# Patient Record
Sex: Female | Born: 1980
Health system: Southern US, Community
[De-identification: ages and names within clinical notes are randomized; demographics above are authoritative.]

## PROBLEM LIST (undated history)

## (undated) DIAGNOSIS — R87629 Unspecified abnormal cytological findings in specimens from vagina: Secondary | ICD-10-CM

## (undated) DIAGNOSIS — O26899 Other specified pregnancy related conditions, unspecified trimester: Secondary | ICD-10-CM

## (undated) DIAGNOSIS — E039 Hypothyroidism, unspecified: Secondary | ICD-10-CM

## (undated) DIAGNOSIS — G5600 Carpal tunnel syndrome, unspecified upper limb: Secondary | ICD-10-CM

## (undated) HISTORY — PX: NO PAST SURGERIES: SHX2092

---

## 2009-01-26 ENCOUNTER — Ambulatory Visit (HOSPITAL_COMMUNITY): Admission: RE | Admit: 2009-01-26 | Discharge: 2009-01-26 | Payer: Self-pay | Admitting: Obstetrics and Gynecology

## 2009-02-22 ENCOUNTER — Ambulatory Visit (HOSPITAL_COMMUNITY): Admission: RE | Admit: 2009-02-22 | Discharge: 2009-02-22 | Payer: Self-pay | Admitting: Obstetrics and Gynecology

## 2009-06-20 ENCOUNTER — Inpatient Hospital Stay (HOSPITAL_COMMUNITY): Admission: AD | Admit: 2009-06-20 | Discharge: 2009-06-23 | Payer: Self-pay | Admitting: Obstetrics and Gynecology

## 2009-06-21 ENCOUNTER — Encounter (INDEPENDENT_AMBULATORY_CARE_PROVIDER_SITE_OTHER): Payer: Self-pay | Admitting: Obstetrics and Gynecology

## 2010-04-08 ENCOUNTER — Encounter: Payer: Self-pay | Admitting: Obstetrics and Gynecology

## 2010-06-06 LAB — CBC
HCT: 35.4 % — ABNORMAL LOW (ref 36.0–46.0)
HCT: 41 % (ref 36.0–46.0)
Hemoglobin: 12.2 g/dL (ref 12.0–15.0)
Hemoglobin: 13.7 g/dL (ref 12.0–15.0)
MCHC: 34.5 g/dL (ref 30.0–36.0)
MCV: 93.4 fL (ref 78.0–100.0)
RBC: 3.79 MIL/uL — ABNORMAL LOW (ref 3.87–5.11)
RBC: 4.43 MIL/uL (ref 3.87–5.11)
WBC: 13.9 10*3/uL — ABNORMAL HIGH (ref 4.0–10.5)
WBC: 14.3 10*3/uL — ABNORMAL HIGH (ref 4.0–10.5)

## 2010-07-24 ENCOUNTER — Other Ambulatory Visit: Payer: Self-pay | Admitting: Obstetrics and Gynecology

## 2011-01-28 ENCOUNTER — Other Ambulatory Visit: Payer: Self-pay | Admitting: Obstetrics and Gynecology

## 2011-01-30 ENCOUNTER — Inpatient Hospital Stay (HOSPITAL_COMMUNITY): Payer: BC Managed Care – PPO

## 2011-01-30 ENCOUNTER — Encounter (HOSPITAL_COMMUNITY): Payer: Self-pay

## 2011-01-30 ENCOUNTER — Encounter (HOSPITAL_COMMUNITY): Payer: Self-pay | Admitting: *Deleted

## 2011-01-30 ENCOUNTER — Inpatient Hospital Stay (HOSPITAL_COMMUNITY)
Admission: AD | Admit: 2011-01-30 | Discharge: 2011-02-01 | DRG: 371 | Disposition: A | Discharge status: Home / Self Care | Payer: BC Managed Care – PPO | Source: Ambulatory Visit | Attending: Obstetrics and Gynecology | Admitting: Obstetrics and Gynecology

## 2011-01-30 ENCOUNTER — Encounter (HOSPITAL_COMMUNITY)
Admission: AD | Disposition: A | Discharge status: Home / Self Care | Payer: Self-pay | Source: Ambulatory Visit | Attending: Obstetrics and Gynecology

## 2011-01-30 DIAGNOSIS — O34219 Maternal care for unspecified type scar from previous cesarean delivery: Principal | ICD-10-CM | POA: Diagnosis present

## 2011-01-30 DIAGNOSIS — E039 Hypothyroidism, unspecified: Secondary | ICD-10-CM | POA: Diagnosis present

## 2011-01-30 DIAGNOSIS — E079 Disorder of thyroid, unspecified: Secondary | ICD-10-CM | POA: Diagnosis present

## 2011-01-30 LAB — ABO/RH: ABO/RH(D): O NEG

## 2011-01-30 LAB — CBC
HCT: 39.3 % (ref 36.0–46.0)
Hemoglobin: 13.2 g/dL (ref 12.0–15.0)
MCH: 30.3 pg (ref 26.0–34.0)
RBC: 4.36 MIL/uL (ref 3.87–5.11)

## 2011-01-30 LAB — RPR: RPR Ser Ql: NONREACTIVE

## 2011-01-30 SURGERY — Surgical Case
Anesthesia: Spinal | Site: Abdomen | Wound class: Clean Contaminated

## 2011-01-30 MED ORDER — SENNOSIDES-DOCUSATE SODIUM 8.6-50 MG PO TABS: 2.0000 | ORAL_TABLET | Freq: Every day | ORAL | Status: DC

## 2011-01-30 MED ORDER — CITRIC ACID-SODIUM CITRATE 334-500 MG/5ML PO SOLN
30.0000 mL | Freq: Once | ORAL | Status: AC
  Administered 2011-01-30: 30 mL via ORAL
  Filled 2011-01-30: qty 15

## 2011-01-30 MED ORDER — MENTHOL 3 MG MT LOZG: 1.0000 | LOZENGE | OROMUCOSAL | Status: DC | PRN

## 2011-01-30 MED ORDER — LACTATED RINGERS IV SOLN: INTRAVENOUS | Status: DC

## 2011-01-30 MED ORDER — KETOROLAC TROMETHAMINE 30 MG/ML IJ SOLN: 30.0000 mg | Freq: Four times a day (QID) | INTRAMUSCULAR | Status: AC | PRN

## 2011-01-30 MED ORDER — CEFAZOLIN SODIUM 1-5 GM-% IV SOLN
1.0000 g | INTRAVENOUS | Status: AC
  Administered 2011-01-30: 1 g via INTRAVENOUS
  Filled 2011-01-30: qty 50

## 2011-01-30 MED ORDER — OXYTOCIN 20 UNITS IN LACTATED RINGERS INFUSION - SIMPLE: 125.0000 mL/h | INTRAVENOUS | Status: AC

## 2011-01-30 MED ORDER — SIMETHICONE 80 MG PO CHEW: 80.0000 mg | CHEWABLE_TABLET | Freq: Three times a day (TID) | ORAL | Status: DC

## 2011-01-30 MED ORDER — DIPHENHYDRAMINE HCL 50 MG/ML IJ SOLN: 12.5000 mg | INTRAMUSCULAR | Status: DC | PRN

## 2011-01-30 MED ORDER — SIMETHICONE 80 MG PO CHEW: 80.0000 mg | CHEWABLE_TABLET | ORAL | Status: DC | PRN

## 2011-01-30 MED ORDER — SODIUM CHLORIDE 0.9 % IV SOLN
1.0000 ug/kg/h | INTRAVENOUS | Status: DC | PRN
  Filled 2011-01-30: qty 2.5

## 2011-01-30 MED ORDER — LACTATED RINGERS IV SOLN
INTRAVENOUS | Status: DC
  Administered 2011-01-30 (×2): via INTRAVENOUS

## 2011-01-30 MED ORDER — OXYCODONE-ACETAMINOPHEN 5-325 MG PO TABS
1.0000 | ORAL_TABLET | ORAL | Status: DC | PRN
  Administered 2011-01-31 – 2011-02-01 (×4): 1 via ORAL
  Filled 2011-01-30 (×4): qty 1

## 2011-01-30 MED ORDER — ONDANSETRON HCL 4 MG/2ML IJ SOLN
INTRAMUSCULAR | Status: DC | PRN
  Administered 2011-01-30: 4 mg via INTRAVENOUS

## 2011-01-30 MED ORDER — ONDANSETRON HCL 4 MG/2ML IJ SOLN: 4.0000 mg | INTRAMUSCULAR | Status: DC | PRN

## 2011-01-30 MED ORDER — MEPERIDINE HCL 25 MG/ML IJ SOLN: 6.2500 mg | INTRAMUSCULAR | Status: DC | PRN

## 2011-01-30 MED ORDER — DIBUCAINE 1 % RE OINT: 1.0000 "application " | TOPICAL_OINTMENT | RECTAL | Status: DC | PRN

## 2011-01-30 MED ORDER — BUPIVACAINE HCL (PF) 0.25 % IJ SOLN
INTRAMUSCULAR | Status: DC | PRN
  Administered 2011-01-30: 10 mL

## 2011-01-30 MED ORDER — DIPHENHYDRAMINE HCL 25 MG PO CAPS: 25.0000 mg | ORAL_CAPSULE | ORAL | Status: DC | PRN

## 2011-01-30 MED ORDER — LEVOTHYROXINE SODIUM 150 MCG PO TABS
150.0000 ug | ORAL_TABLET | Freq: Every day | ORAL | Status: DC
  Administered 2011-01-31 – 2011-02-01 (×2): 150 ug via ORAL
  Filled 2011-01-30 (×2): qty 1

## 2011-01-30 MED ORDER — OXYCODONE-ACETAMINOPHEN 5-325 MG PO TABS: 1.0000 | ORAL_TABLET | ORAL | Status: DC | PRN

## 2011-01-30 MED ORDER — WITCH HAZEL-GLYCERIN EX PADS: 1.0000 "application " | MEDICATED_PAD | CUTANEOUS | Status: DC | PRN

## 2011-01-30 MED ORDER — PRENATAL PLUS 27-1 MG PO TABS: 1.0000 | ORAL_TABLET | Freq: Every day | ORAL | Status: DC

## 2011-01-30 MED ORDER — SIMETHICONE 80 MG PO CHEW
80.0000 mg | CHEWABLE_TABLET | Freq: Three times a day (TID) | ORAL | Status: DC
  Administered 2011-01-30 – 2011-02-01 (×7): 80 mg via ORAL

## 2011-01-30 MED ORDER — TETANUS-DIPHTH-ACELL PERTUSSIS 5-2.5-18.5 LF-MCG/0.5 IM SUSP: 0.5000 mL | Freq: Once | INTRAMUSCULAR | Status: DC

## 2011-01-30 MED ORDER — ONDANSETRON HCL 4 MG PO TABS: 4.0000 mg | ORAL_TABLET | ORAL | Status: DC | PRN

## 2011-01-30 MED ORDER — METHYLERGONOVINE MALEATE 0.2 MG PO TABS: 0.2000 mg | ORAL_TABLET | ORAL | Status: DC | PRN

## 2011-01-30 MED ORDER — IBUPROFEN 600 MG PO TABS: 600.0000 mg | ORAL_TABLET | Freq: Four times a day (QID) | ORAL | Status: DC

## 2011-01-30 MED ORDER — NALBUPHINE HCL 10 MG/ML IJ SOLN
5.0000 mg | INTRAMUSCULAR | Status: DC | PRN
  Filled 2011-01-30: qty 1

## 2011-01-30 MED ORDER — LANOLIN HYDROUS EX OINT: 1.0000 "application " | TOPICAL_OINTMENT | CUTANEOUS | Status: DC | PRN

## 2011-01-30 MED ORDER — MORPHINE SULFATE (PF) 0.5 MG/ML IJ SOLN
INTRAMUSCULAR | Status: DC | PRN
  Administered 2011-01-30: .2 mg via INTRATHECAL

## 2011-01-30 MED ORDER — ONDANSETRON HCL 4 MG/2ML IJ SOLN
4.0000 mg | INTRAMUSCULAR | Status: DC | PRN
Start: 2011-01-30 — End: 2011-02-01

## 2011-01-30 MED ORDER — IBUPROFEN 600 MG PO TABS: 600.0000 mg | ORAL_TABLET | Freq: Four times a day (QID) | ORAL | Status: DC | PRN

## 2011-01-30 MED ORDER — METHYLERGONOVINE MALEATE 0.2 MG/ML IJ SOLN: 0.2000 mg | INTRAMUSCULAR | Status: DC | PRN

## 2011-01-30 MED ORDER — ONDANSETRON HCL 4 MG/2ML IJ SOLN: 4.0000 mg | Freq: Three times a day (TID) | INTRAMUSCULAR | Status: DC | PRN

## 2011-01-30 MED ORDER — IBUPROFEN 600 MG PO TABS
600.0000 mg | ORAL_TABLET | Freq: Four times a day (QID) | ORAL | Status: DC
  Administered 2011-01-30 – 2011-02-01 (×7): 600 mg via ORAL
  Filled 2011-01-30 (×7): qty 1

## 2011-01-30 MED ORDER — ZOLPIDEM TARTRATE 5 MG PO TABS: 5.0000 mg | ORAL_TABLET | Freq: Every evening | ORAL | Status: DC | PRN

## 2011-01-30 MED ORDER — BUTORPHANOL TARTRATE 2 MG/ML IJ SOLN
1.0000 mg | Freq: Once | INTRAMUSCULAR | Status: AC
  Administered 2011-01-30: 2 mg via INTRAVENOUS
  Filled 2011-01-30: qty 1

## 2011-01-30 MED ORDER — DIPHENHYDRAMINE HCL 50 MG/ML IJ SOLN: 25.0000 mg | INTRAMUSCULAR | Status: DC | PRN

## 2011-01-30 MED ORDER — PROMETHAZINE HCL 25 MG/ML IJ SOLN: 6.2500 mg | INTRAMUSCULAR | Status: DC | PRN

## 2011-01-30 MED ORDER — OXYTOCIN 10 UNIT/ML IJ SOLN
INTRAMUSCULAR | Status: DC | PRN
  Administered 2011-01-30: 40 [IU] via INTRAMUSCULAR

## 2011-01-30 MED ORDER — DIPHENHYDRAMINE HCL 25 MG PO CAPS: 25.0000 mg | ORAL_CAPSULE | Freq: Four times a day (QID) | ORAL | Status: DC | PRN

## 2011-01-30 MED ORDER — KETOROLAC TROMETHAMINE 30 MG/ML IJ SOLN: 15.0000 mg | Freq: Once | INTRAMUSCULAR | Status: AC | PRN

## 2011-01-30 MED ORDER — SODIUM CHLORIDE 0.9 % IJ SOLN: 3.0000 mL | INTRAMUSCULAR | Status: DC | PRN

## 2011-01-30 MED ORDER — LACTATED RINGERS IV SOLN
INTRAVENOUS | Status: DC | PRN
  Administered 2011-01-30 (×2): via INTRAVENOUS

## 2011-01-30 MED ORDER — BUPIVACAINE IN DEXTROSE 0.75-8.25 % IT SOLN
INTRATHECAL | Status: DC | PRN
  Administered 2011-01-30: 1.4 mL via INTRATHECAL

## 2011-01-30 MED ORDER — LACTATED RINGERS IV SOLN
INTRAVENOUS | Status: DC
  Administered 2011-01-30: 13:00:00 via INTRAVENOUS

## 2011-01-30 MED ORDER — FENTANYL CITRATE 0.05 MG/ML IJ SOLN
INTRAMUSCULAR | Status: DC | PRN
  Administered 2011-01-30: 12.5 ug via INTRATHECAL
  Administered 2011-01-30: 50 ug via INTRAVENOUS
  Administered 2011-01-30: 12.5 ug via INTRAVENOUS
  Administered 2011-01-30: 25 ug via INTRAVENOUS

## 2011-01-30 MED ORDER — OXYTOCIN 20 UNITS IN LACTATED RINGERS INFUSION - SIMPLE
125.0000 mL/h | INTRAVENOUS | Status: DC
  Administered 2011-01-30: 125 mL/h via INTRAVENOUS

## 2011-01-30 MED ORDER — TETANUS-DIPHTH-ACELL PERTUSSIS 5-2.5-18.5 LF-MCG/0.5 IM SUSP
0.5000 mL | Freq: Once | INTRAMUSCULAR | Status: AC
  Administered 2011-01-31: 0.5 mL via INTRAMUSCULAR
  Filled 2011-01-30: qty 0.5

## 2011-01-30 MED ORDER — FAMOTIDINE IN NACL 20-0.9 MG/50ML-% IV SOLN
20.0000 mg | Freq: Once | INTRAVENOUS | Status: AC
  Administered 2011-01-30: 20 mg via INTRAVENOUS
  Filled 2011-01-30: qty 50

## 2011-01-30 MED ORDER — NALOXONE HCL 0.4 MG/ML IJ SOLN: 0.4000 mg | INTRAMUSCULAR | Status: DC | PRN

## 2011-01-30 MED ORDER — PRENATAL PLUS 27-1 MG PO TABS
1.0000 | ORAL_TABLET | Freq: Every day | ORAL | Status: DC
  Administered 2011-01-31 – 2011-02-01 (×2): 1 via ORAL
  Filled 2011-01-30 (×2): qty 1

## 2011-01-30 MED ORDER — SENNOSIDES-DOCUSATE SODIUM 8.6-50 MG PO TABS
2.0000 | ORAL_TABLET | Freq: Every day | ORAL | Status: DC
  Administered 2011-01-30 – 2011-01-31 (×2): 2 via ORAL

## 2011-01-30 MED ORDER — HYDROMORPHONE HCL PF 1 MG/ML IJ SOLN: 0.2500 mg | INTRAMUSCULAR | Status: DC | PRN

## 2011-01-30 MED ORDER — KETOROLAC TROMETHAMINE 60 MG/2ML IM SOLN
60.0000 mg | Freq: Once | INTRAMUSCULAR | Status: AC | PRN
  Administered 2011-01-30: 60 mg via INTRAMUSCULAR

## 2011-01-30 SURGICAL SUPPLY — 28 items
CLOTH BEACON ORANGE TIMEOUT ST (SAFETY) ×2 IMPLANT
CONTAINER PREFILL 10% NBF 15ML (MISCELLANEOUS) IMPLANT
DRESSING TELFA 8X3 (GAUZE/BANDAGES/DRESSINGS) ×2 IMPLANT
ELECT REM PT RETURN 9FT ADLT (ELECTROSURGICAL) ×2
ELECTRODE REM PT RTRN 9FT ADLT (ELECTROSURGICAL) ×1 IMPLANT
EXTRACTOR VACUUM M CUP 4 TUBE (SUCTIONS) IMPLANT
GAUZE SPONGE 4X4 12PLY STRL LF (GAUZE/BANDAGES/DRESSINGS) ×2 IMPLANT
GLOVE BIO SURGEON STRL SZ7.5 (GLOVE) ×4 IMPLANT
GOWN PREVENTION PLUS LG XLONG (DISPOSABLE) ×4 IMPLANT
GOWN PREVENTION PLUS XLARGE (GOWN DISPOSABLE) ×2 IMPLANT
KIT ABG SYR 3ML LUER SLIP (SYRINGE) IMPLANT
NEEDLE HYPO 25X1 1.5 SAFETY (NEEDLE) ×2 IMPLANT
NEEDLE HYPO 25X5/8 SAFETYGLIDE (NEEDLE) IMPLANT
NS IRRIG 1000ML POUR BTL (IV SOLUTION) ×2 IMPLANT
PACK C SECTION WH (CUSTOM PROCEDURE TRAY) ×2 IMPLANT
PAD ABD 7.5X8 STRL (GAUZE/BANDAGES/DRESSINGS) ×2 IMPLANT
SLEEVE SCD COMPRESS KNEE MED (MISCELLANEOUS) ×2 IMPLANT
STAPLER VISISTAT 35W (STAPLE) ×2 IMPLANT
SUT MNCRL 0 VIOLET CTX 36 (SUTURE) ×2 IMPLANT
SUT MON AB 2-0 CT1 27 (SUTURE) ×2 IMPLANT
SUT MON AB-0 CT1 36 (SUTURE) ×4 IMPLANT
SUT MONOCRYL 0 CTX 36 (SUTURE) ×2
SUT PLAIN 0 NONE (SUTURE) IMPLANT
SUT PLAIN 2 0 XLH (SUTURE) IMPLANT
SYR CONTROL 10ML LL (SYRINGE) ×2 IMPLANT
TOWEL OR 17X24 6PK STRL BLUE (TOWEL DISPOSABLE) ×4 IMPLANT
TRAY FOLEY CATH 14FR (SET/KITS/TRAYS/PACK) ×2 IMPLANT
WATER STERILE IRR 1000ML POUR (IV SOLUTION) ×2 IMPLANT

## 2011-01-30 NOTE — Progress Notes (Signed)
Dr Arby Barrette ,Dr Anibal Henderson in [place] and Kenney Houseman, AC-notified of upcoming repeat C/S

## 2011-01-30 NOTE — Op Note (Signed)
Cesarean Section Procedure Note  Indications: Previous Csection x 1 with SROM , meconium and active labor  Pre-operative Diagnosis: 38 week 0 day pregnancy.  Post-operative Diagnosis: same  Surgeon: Lenoard Aden   Assistants: none  Anesthesia: Spinal anesthesia  ASA Class: 1, 2  Procedure Details  The patient was seen in the Holding Room. The risks, benefits, complications, treatment options, and expected outcomes were discussed with the patient.  The patient concurred with the proposed plan, giving informed consent. The risks of anesthesia, infection, bleeding and possible injury to other organs discussed. Injury to bowel, bladder, or ureter with possible need for repair discussed. Possible need for transfusion with secondary risks of hepatitis or HIV acquisition discussed. Post operative complications to include but not limited to DVT, PE and Pneumonia noted. The site of surgery properly noted/marked. The patient was taken to Operating Room # 1, identified as Charlise Giovanetti and the procedure verified as C-Section Delivery. A Time Out was held and the above information confirmed.  After induction of anesthesia, the patient was draped and prepped in the usual sterile manner. A Pfannenstiel incision was made and carried down through the subcutaneous tissue to the fascia. Fascial incision was made and extended transversely using Mayo scissors. The fascia was separated from the underlying rectus tissue superiorly and inferiorly. The peritoneum was identified and entered. Peritoneal incision was extended longitudinally. The utero-vesical peritoneal reflection was incised transversely and the bladder flap was bluntly freed from the lower uterine segment. A low transverse uterine incision(Kerr hysterotomy) was made. Delivered from vertex presentation was a  Female  with Apgar scores of 8 at one minute and 9 at five minutes. After the umbilical cord was clamped and cut cord blood was obtained for  evaluation. The placenta was removed intact and appeared normal. The uterine outline, tubes and ovaries appeared normal. The uterine incision was closed with running locked sutures of 0 Monocryl x 2 layers. Interrupted suture x one in midline. Hemostasis was observed. Lavage was carried out until clear. The fascia was then reapproximated with running sutures of 0 Monocryl. The skin was reapproximated with staples.  Instrument, sponge, and needle counts were correct prior the abdominal closure and at the conclusion of the case.   Findings: above  Estimated Blood Loss:  500         Drains: foley                 Specimens: placenta                 Complications:  None; patient tolerated the procedure well.         Disposition: PACU - hemodynamically stable.         Condition: stable  Attending Attestation: I performed the procedure.

## 2011-01-30 NOTE — Progress Notes (Signed)
Dr Billy Coast notified that the pt is ready for OR

## 2011-01-30 NOTE — Transfer of Care (Signed)
Immediate Anesthesia Transfer of Care Note  Patient: Melody Turner  Procedure(s) Performed:  CESAREAN SECTION - Baby Girl @ [phone], Apgars 8/9  Patient Location: PACU  Anesthesia Type: Spinal  Level of Consciousness: awake, alert  and oriented  Airway & Oxygen Therapy: Patient Spontanous Breathing  Post-op Assessment: Report given to PACU RN and Post -op Vital signs reviewed and stable  Post vital signs: Reviewed and stable  Complications: No apparent anesthesia complications

## 2011-01-30 NOTE — H&P (Signed)
Melody Turner, Melody Turner NO.:  [id number]  MEDICAL RECORD NO.:  [id number]  LOCATION:  JX91                          FACILITY:  [facility]  PHYSICIAN:  Lenoard Aden, M.D.DATE OF BIRTH:  [birth year]  DATE OF ADMISSION:  01/30/2011 DATE OF DISCHARGE:                             HISTORY & PHYSICAL   CHIEF COMPLAINT:  Ruptured membranes.  HISTORY OF PRESENT ILLNESS:  She is a 30 year old white female G3, P-1-0- 1-1 at 54 weeks' gestation with spontaneous rupture of membranes at 4 a.m.  She has no known drug allergies.  MEDICATIONS:  Prenatal vitamins and thyroid replacement therapy.  She is a nonsmoker, nondrinker.  She denies domestic or physical violence.  She has a family history of hypertension, heart disease, and breast cancer.  She has a history of one C-section in 2011.  Otherwise, pregnancy course uncomplicated.  PHYSICAL EXAMINATION:  GENERAL:  She is an uncomfortable-appearing white female, in no acute distress. HEENT:  Normal. NECK:  Supple.  Full range of motion. LUNGS:  Clear. HEART:  Regular rate and rhythm. ABDOMEN:  Soft, gravid, and nontender.  No CVA tenderness. EXTREMITIES:  No cords. NEUROLOGIC:  Nonfocal. SKIN:  Intact.  IMPRESSION: 1. Term intrauterine pregnancy with spontaneous rupture of membranes     and meconium in early labor. 2. Hypothyroidism. 3. Previous C-section, repeat.  PLAN:  Proceed with elective repeat cesarean section.  Risks of anesthesia, infection, bleeding, injury to abdominal organs, need for repair was discussed, delayed versus immediate complications to include bowel and bladder injury noted.  The patient acknowledges and wishes to proceed.     Lenoard Aden, M.D.     RJT/MEDQ  D:  01/30/2011  T:  01/30/2011  Job:  [id number]

## 2011-01-30 NOTE — Anesthesia Preprocedure Evaluation (Addendum)
Anesthesia Evaluation  Patient identified by MRN, date of birth, ID band Patient awake    Reviewed: Allergy & Precautions, H&P , NPO status , Patient's Chart, lab work & pertinent test results  Airway Mallampati: I TM Distance: >3 FB Neck ROM: full    Dental No notable dental hx.    Pulmonary neg pulmonary ROS,    Pulmonary exam normal       Cardiovascular neg cardio ROS     Neuro/Psych Negative Neurological ROS  Negative Psych ROS   GI/Hepatic negative GI ROS, Neg liver ROS,   Endo/Other  Hypothyroidism   Renal/GU negative Renal ROS  Genitourinary negative   Musculoskeletal negative musculoskeletal ROS (+)   Abdominal Normal abdominal exam  (+)   Peds negative pediatric ROS (+)  Hematology negative hematology ROS (+)   Anesthesia Other Findings   Reproductive/Obstetrics (+) Pregnancy                           Anesthesia Physical Anesthesia Plan  ASA: II and Emergent  Anesthesia Plan: Spinal   Post-op Pain Management:    Induction:   Airway Management Planned:   Additional Equipment:   Intra-op Plan:   Post-operative Plan:   Informed Consent: I have reviewed the patients History and Physical, chart, labs and discussed the procedure including the risks, benefits and alternatives for the proposed anesthesia with the patient or authorized representative who has indicated his/her understanding and acceptance.     Plan Discussed with:   Anesthesia Plan Comments:         Anesthesia Quick Evaluation

## 2011-01-30 NOTE — Progress Notes (Signed)
Patient expressed need to have synthroid in the AM. Noted that no order has been placed for the medication. Called Dr. Seymour Bars. Order received. Will continue to monitor patient.

## 2011-01-30 NOTE — Anesthesia Postprocedure Evaluation (Signed)
Anesthesia Post Note  Patient: Melody Turner  Procedure(s) Performed:  CESAREAN SECTION - Baby Girl @ [phone], Apgars 8/9  Anesthesia type: Spinal  Patient location: PACU  Post pain: Pain level controlled  Post assessment: Post-op Vital signs reviewed  Last Vitals:  Filed Vitals:   01/30/11 0820  BP: 124/59  Pulse: 91  Temp: 36.6 C  Resp: 18    Post vital signs: Reviewed  Level of consciousness: awake  Complications: No apparent anesthesia complications

## 2011-01-30 NOTE — ED Provider Notes (Signed)
Melody Turner is a 30 y.o. female presenting for labor with SROM, meconium. History See dictated note OB History    Grav Para Term Preterm Abortions TAB SAB Ect Mult Living   3 1 1       1      No past medical history on file. Past Surgical History  Procedure Date  . Cesarean section   . No past surgeries    Family History: family history includes Hypertension in her father. Social History:  reports that she has never smoked. She has never used smokeless tobacco. She reports that she does not drink alcohol or use illicit drugs.  ROS negative  Dilation: 2 Effacement (%): 80 Station: -2 Exam by:: M.Topp,RN Blood pressure 132/58, pulse 86, temperature 98.6 F (37 C), temperature source Oral, resp. rate 20, height 5\' 3"  (1.6 m), weight 67.586 kg (149 lb), SpO2 97.00%. Exam Physical Exam see dictated note Prenatal labs: ABO, Rh:  see record Antibody:   Rubella:   RPR:    HBsAg:    HIV:    GBS:     Assessment/Plan: 38 week iup with SROM Previous Csection for rpt   Note dictated [id number]  Melody Turner J 01/30/2011, 6:25 AM

## 2011-01-30 NOTE — Progress Notes (Signed)
To OR

## 2011-01-30 NOTE — Anesthesia Procedure Notes (Signed)
Spinal  Patient location during procedure: OR Start time: 01/30/2011 7:33 AM End time: 01/30/2011 7:36 AM Staffing Anesthesiologist: Sandrea Hughs Preanesthetic Checklist Completed: patient identified, site marked, surgical consent, pre-op evaluation, timeout performed, IV checked, risks and benefits discussed and monitors and equipment checked Spinal Block Patient position: sitting Prep: DuraPrep Patient monitoring: heart rate, cardiac monitor, continuous pulse ox and blood pressure Approach: midline Location: L3-4 Injection technique: single-shot Needle Needle type: Sprotte  Needle gauge: 24 G Needle length: 9 cm Needle insertion depth: 5 cm Assessment Sensory level: T4

## 2011-01-31 LAB — CBC
HCT: 31.2 % — ABNORMAL LOW (ref 36.0–46.0)
MCHC: 33.3 g/dL (ref 30.0–36.0)
MCV: 92.6 fL (ref 78.0–100.0)
RDW: 13.8 % (ref 11.5–15.5)

## 2011-01-31 NOTE — Addendum Note (Signed)
Addendum  created 01/31/11 0826 by Suella Grove   Modules edited:Notes Section

## 2011-01-31 NOTE — Progress Notes (Signed)
  S:         Reports feeling ok - soreness and stiffness in abdomen             Tolerating po intake / no nausea / no vomiting / some flatus / no BM             Bleeding is light             Pain -prescription NSAID's including motrin and narcotic analgesics including percocet             Up ad lib / ambulatory  Newborn breast feeding  / female infant  O:  A & O x 3 NAD             VS: Blood pressure 99/60, pulse 85, temperature 97.8 F (36.6 C), temperature source Oral, resp. rate 16, height 5\' 3"  (1.6 m), weight 67.586 kg (149 lb), SpO2 97.00%, unknown if currently breastfeeding.  LABS:  Lab Results  Component Value Date   WBC 11.5* 01/31/2011   HGB 10.4* 01/31/2011   HCT 31.2* 01/31/2011   MCV 92.6 01/31/2011   PLT 185 01/31/2011     I&O: I/O last 3 completed shifts: In: 4555 [P.O.:1430; I.V.:3125] Out: 5525 [Urine:4725; Blood:800]      Lungs: Clear and unlabored  Heart: regular rate and rhythm / no mumurs  Abdomen: soft, non-tender, non-distended, hypoactive BS             Fundus: firm, non-tender, U-1             Dressing OFF              Incision:  approximated with staples / no erythema / no ecchymosis / no drainage  Perineum: no edema  Lochia: light  Extremities: no edema, no calf pain or tenderness  A:        POD # 1 S/P cesarean section / female            P:        Routine postoperative care              May consider early discharge tomorrow     BAILEY,TANYA 01/31/2011, 9:23 AM

## 2011-01-31 NOTE — Anesthesia Postprocedure Evaluation (Signed)
  Anesthesia Post-op Note  Patient: Melody Turner  Procedure(s) Performed:  CESAREAN SECTION - Baby Girl @ [phone], Apgars 8/9  Patient Location: Mother/Baby  Anesthesia Type: Spinal  Level of Consciousness: awake  Airway and Oxygen Therapy: Patient Spontanous Breathing  Post-op Pain: none  Post-op Assessment: Post-op Vital signs reviewed, Respiratory Function Stable, No signs of Nausea or vomiting, Adequate PO intake and Pain level controlled  Post-op Vital Signs: Reviewed and stable  Complications: No apparent anesthesia complications

## 2011-02-01 ENCOUNTER — Encounter (HOSPITAL_COMMUNITY): Payer: Self-pay | Admitting: Obstetrics and Gynecology

## 2011-02-01 MED ORDER — IBUPROFEN 600 MG PO TABS: 600.0000 mg | ORAL_TABLET | Freq: Four times a day (QID) | ORAL | Status: AC | PRN

## 2011-02-01 MED ORDER — OXYCODONE-ACETAMINOPHEN 5-325 MG PO TABS: 1.0000 | ORAL_TABLET | Freq: Four times a day (QID) | ORAL | Status: AC | PRN

## 2011-02-01 NOTE — Progress Notes (Signed)
  POD # 2  Subjective: Pt reports feeling well and eager for early d/c home/ Pain controlled with prescription NSAID's including ibuprofen (Motrin) and narcotic analgesics including Percocet Tolerating po/Voiding without problems/ No n/v/Flatus pos Activity: walk in hall Bleeding is spotting Newborn info:  Information for the patient's newborn:  Shakya, [place] [[id number]]  female Feeding: breast   Objective: VS: Blood pressure 106/68, pulse 81, temperature 98.3 F (36.8 C), temperature source Oral, resp. rate 18  Physical Exam:  General: alert, cooperative and no distress CV: Regular rate and rhythm Resp: clear Abdomen: soft, NT; active bS x 4 quads Incision: well approximated and closed with staples; no erythema, ecchymosis, or edema; C/D/I Uterine Fundus: firm, below umbilicus, nontender Perineum: not examined Lochia: minimal Ext: Homans sign is negative, no sign of DVT and no edema, redness or tenderness in the calves or thighs    A/P: POD # 2/ [id number] S/P Repeat C/S Stable for early d/c home RX: Ibuprofen 600mg  po Q 6 hrs prn pain #30 Refill x 1 Percocet 5/325 1 - 2 tabs po every 6 hrs prn pain  #30 No refill Staple removal on Monday, 02/04/11 @ 130pm [facility] post partum booklet will be given to patient. Rt pp visit in 6 weeks.

## 2011-02-01 NOTE — Discharge Summary (Signed)
Obstetric Discharge Summary Reason for Admission: rupture of membranes and hx of previous C/S (38wks) Prenatal Procedures: ultrasound Intrapartum Procedures: cesarean: low cervical, transverse Postpartum Procedures: none Complications-Operative and Postpartum: none Hemoglobin  Date Value Range Status  01/31/2011 10.4* 12.0-15.0 (g/dL) Final     DELTA CHECK NOTED     REPEATED TO VERIFY     HCT  Date Value Range Status  01/31/2011 31.2* 36.0-46.0 (%) Final    Discharge Diagnoses: Term Pregnancy-delivered and Repeat C/S  Discharge Information: Date: 02/01/2011 Activity: pelvic rest Diet: routine Medications: Ibuprofen and Percocet Condition: stable Instructions: refer to practice specific booklet Discharge to: home Follow-up Information    Follow up with Lenoard Aden, MD in 6 weeks. (Staple removal in the office on MONDAY, 02/04/11 @ 1:30pm)    Contact information:   [address] [place] [address] [phone]          Newborn Data: Live born female on 01/30/11 Birth Weight: 7 lb 5.5 oz (3331 g) APGAR: 8, 9  Home with mother.  Wess Baney K 02/01/2011, 10:32 AM

## 2011-02-03 ENCOUNTER — Encounter (HOSPITAL_COMMUNITY): Payer: Self-pay | Admitting: Obstetrics and Gynecology

## 2011-02-04 ENCOUNTER — Other Ambulatory Visit (HOSPITAL_COMMUNITY): Payer: BC Managed Care – PPO

## 2011-02-08 ENCOUNTER — Encounter (HOSPITAL_COMMUNITY): Admission: RE | Payer: Self-pay | Source: Ambulatory Visit

## 2011-02-08 ENCOUNTER — Inpatient Hospital Stay (HOSPITAL_COMMUNITY)
Admission: RE | Admit: 2011-02-08 | Payer: BC Managed Care – PPO | Source: Ambulatory Visit | Admitting: Obstetrics and Gynecology

## 2011-02-08 SURGERY — Surgical Case
Anesthesia: Spinal

## 2011-07-13 IMAGING — US US OB DETAIL+14 WK
1 series · 14 of 28 positions shown · non-contrast
Comparison: none

OBSTETRICAL ULTRASOUND:
 This ultrasound was performed in The [HOSPITAL], and the [facility] report will be stored to [REDACTED] PACS.  This report is also available in [HOSPITAL]?s accessANYware.

[Series 1: us ob detail+14 wk · 14 of 99 slices shown]
[im 4/99]
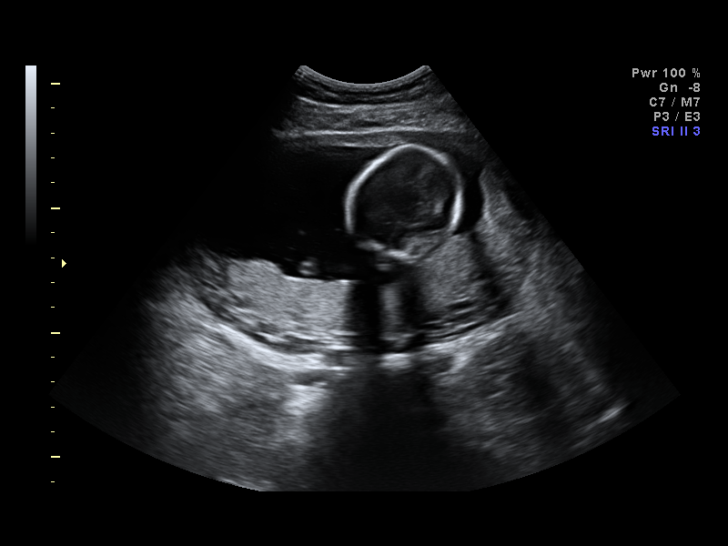
[im 11/99]
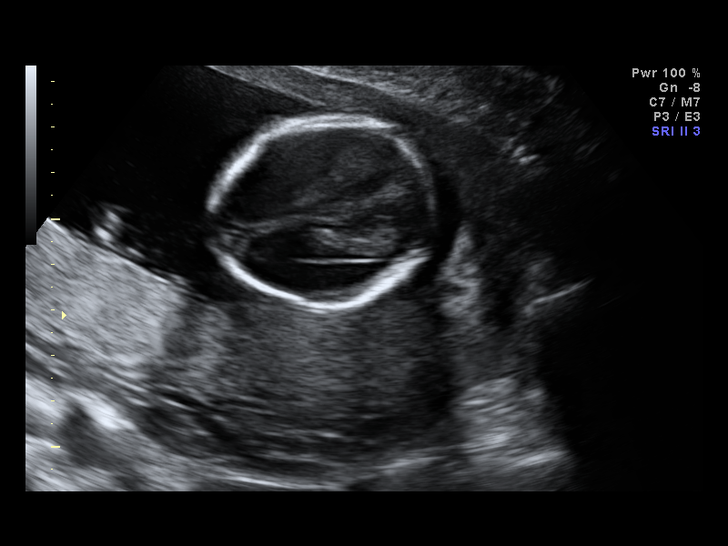
[im 19/99]
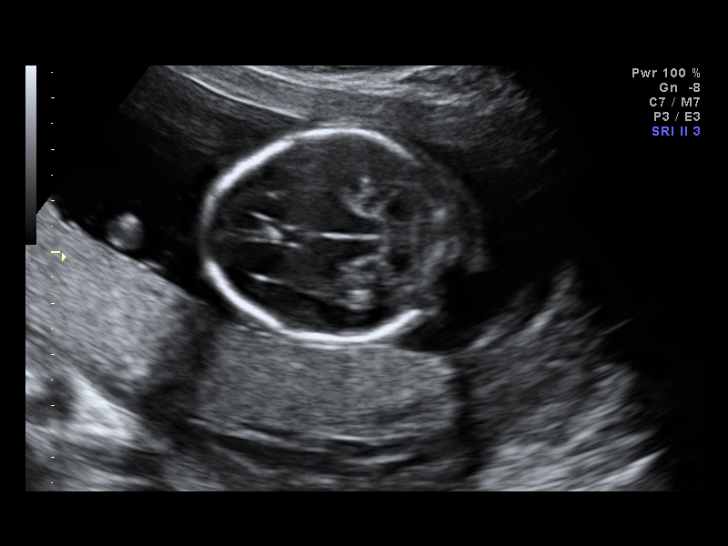
[im 26/99]
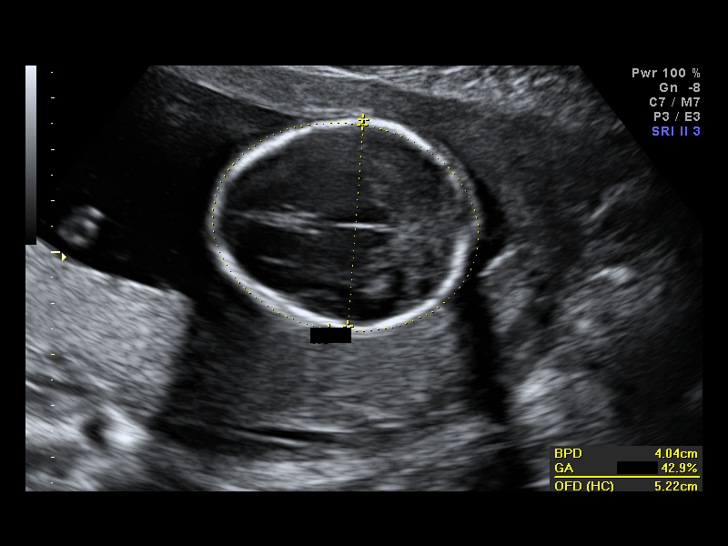
[im 33/99]
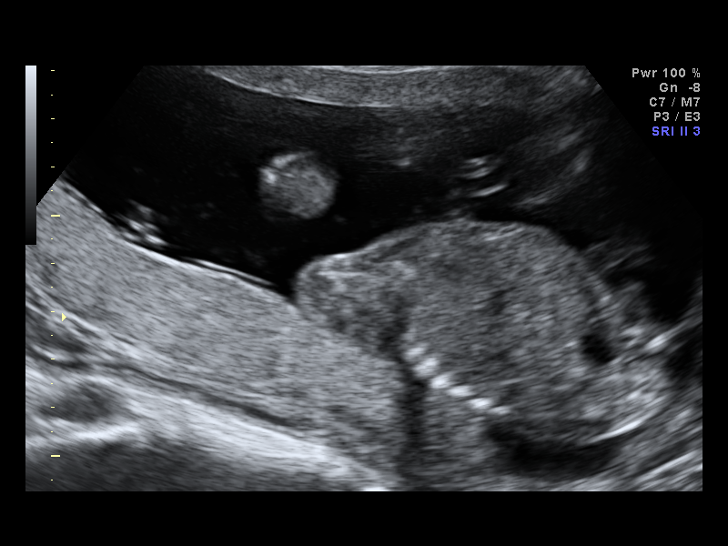
[im 40/99]
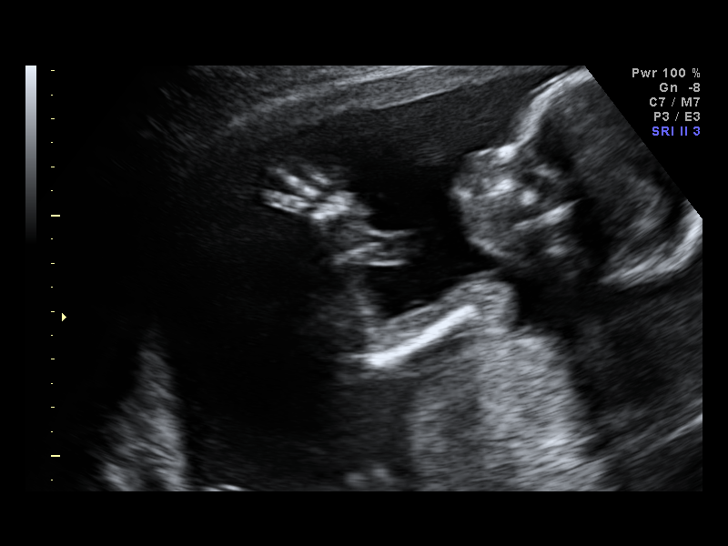
[im 48/99]
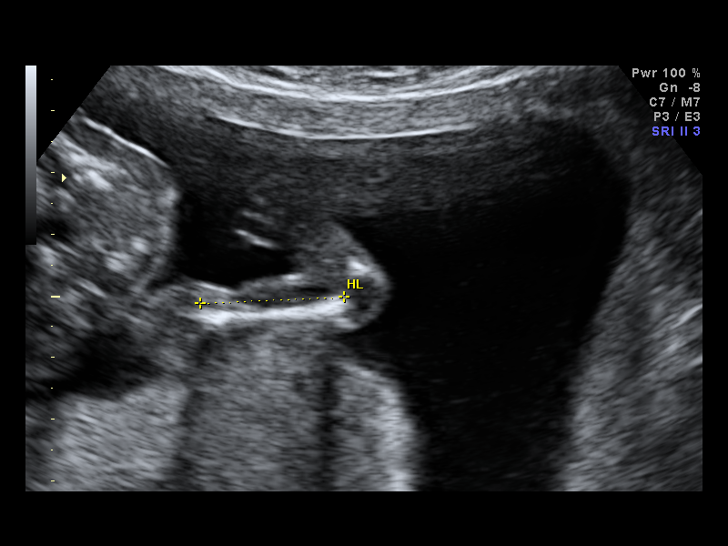
[im 55/99]
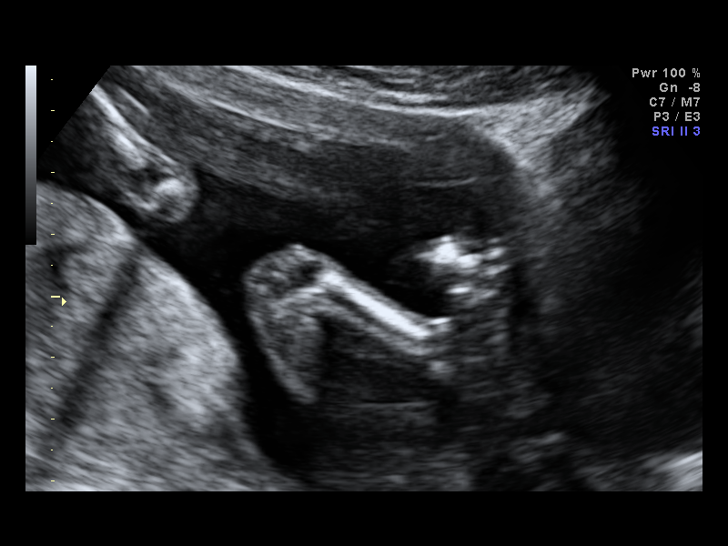
[im 62/99]
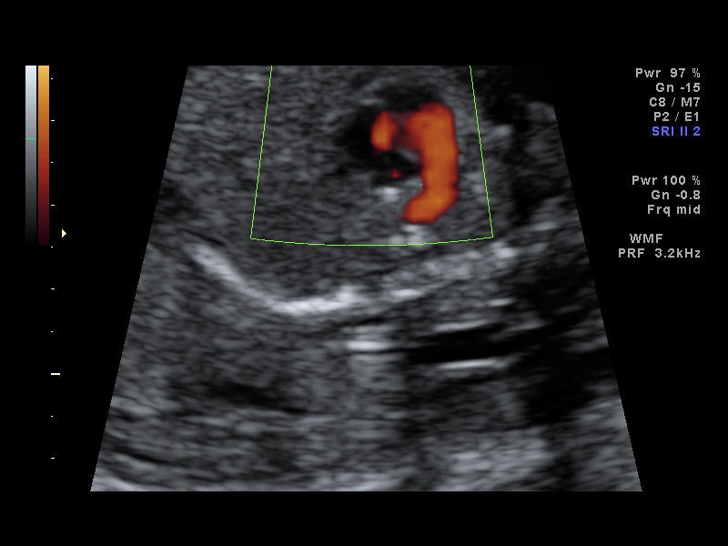
[im 69/99]
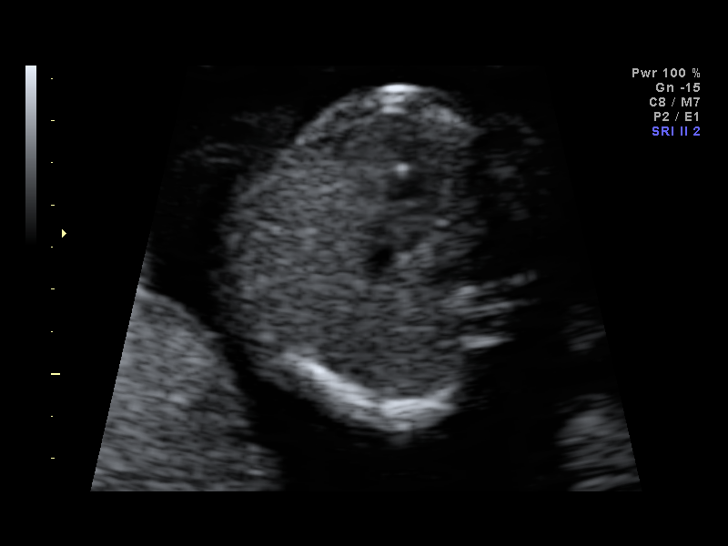
[im 77/99]
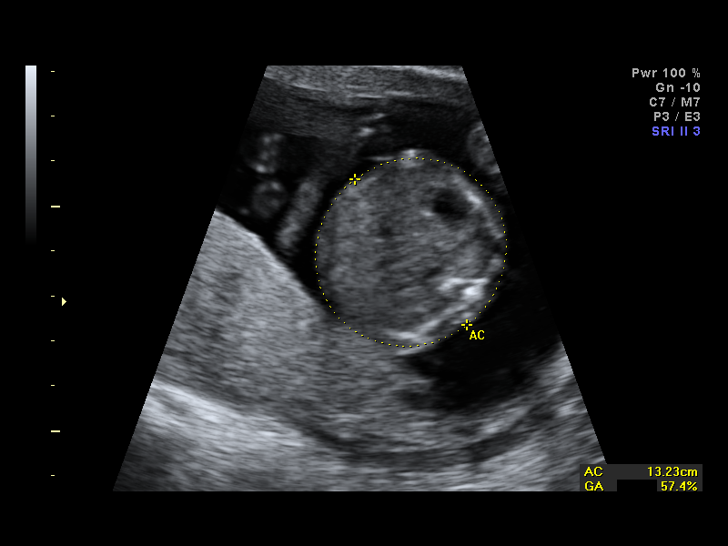
[im 84/99]
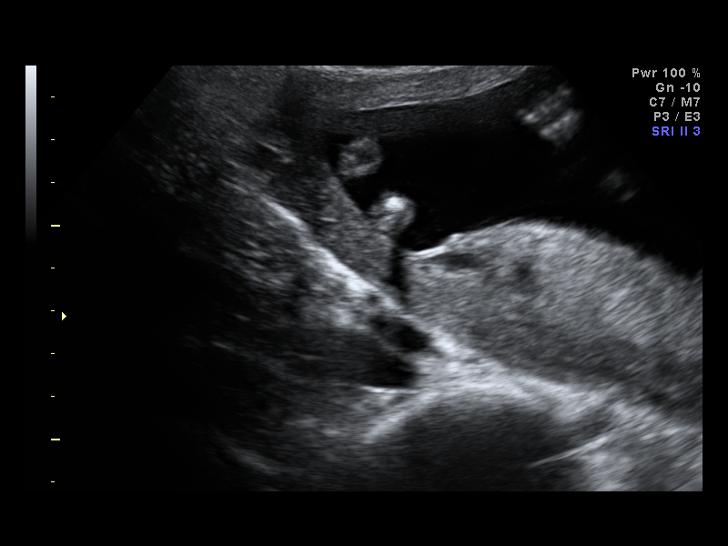
[im 91/99]
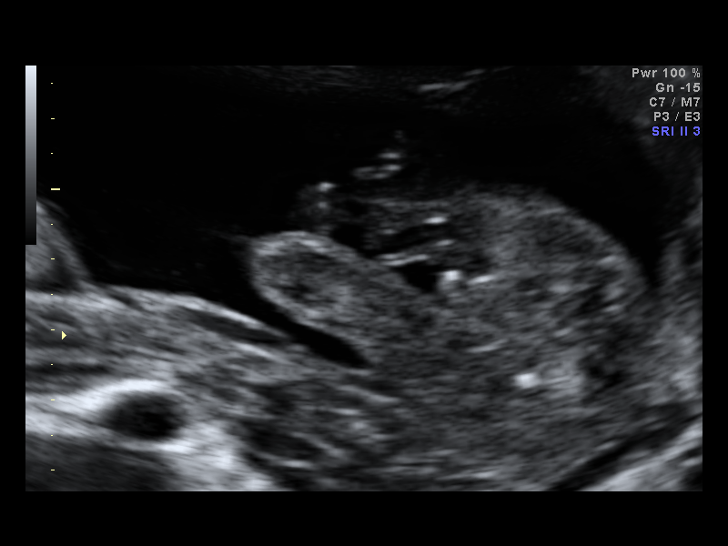
[im 99/99]
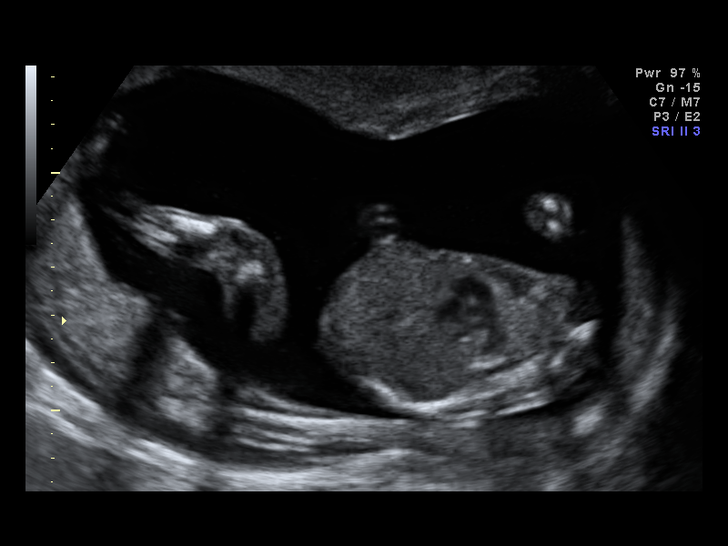

[14 of 28 positions shown; findings below may reference images not displayed]

IMPRESSION: [facility] has also been faxed to the ordering physician.

## 2011-08-09 IMAGING — US US OB FOLLOW-UP
1 series · 18 of 28 positions shown · non-contrast
Comparison: none

OBSTETRICAL ULTRASOUND:
 This ultrasound was performed in The [[facility]OSP[facility]], and the [facility] report will be stored to [REDACTED] PACS.  This report is also available in [HOSPITAL]?s accessANYware.

[Series 1: us ob follow-up · 18 of 69 slices shown]
[im 1/69]
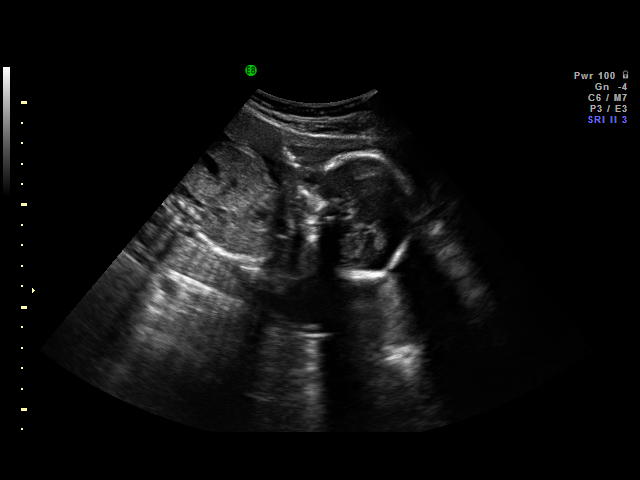
[im 6/69]
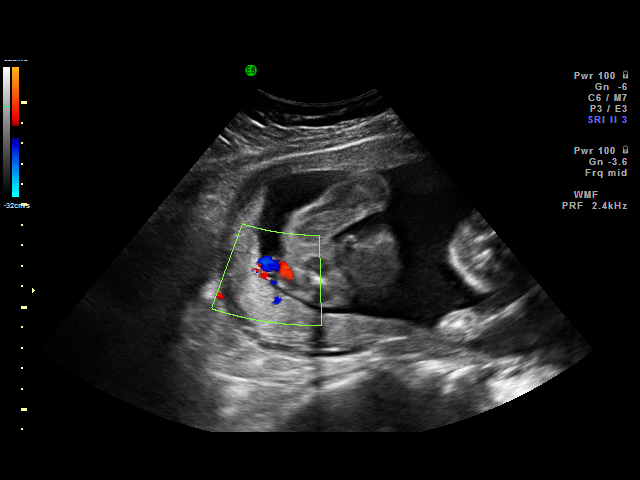
[im 8/69]
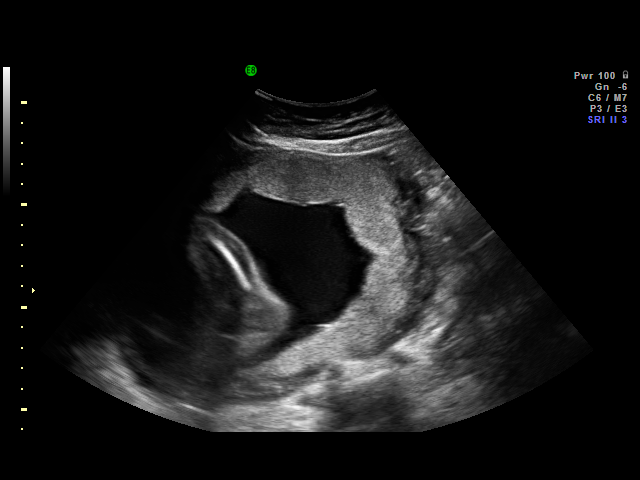
[im 13/69]
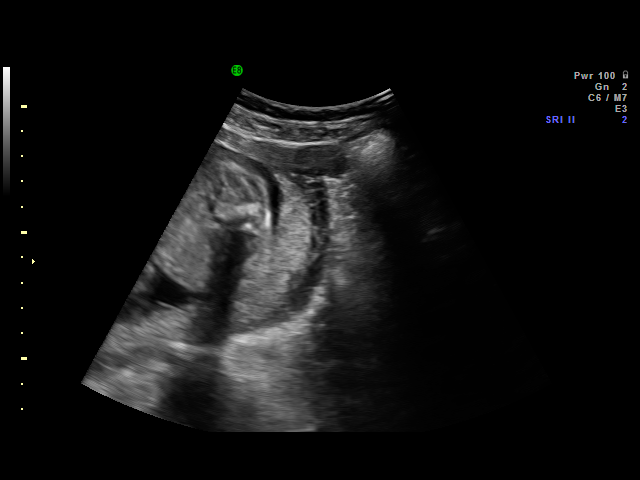
[im 18/69]
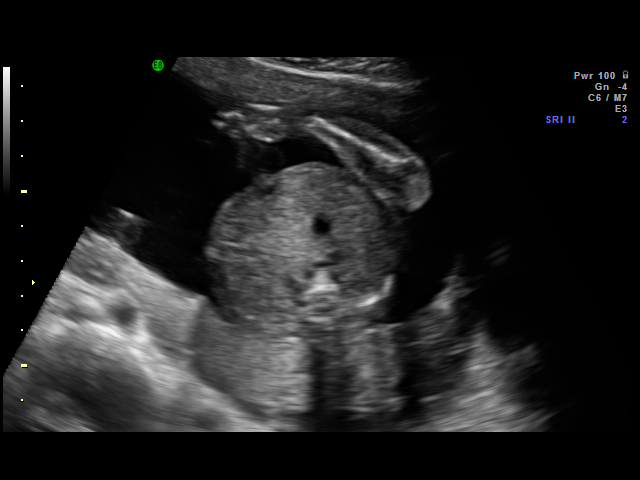
[im 21/69]
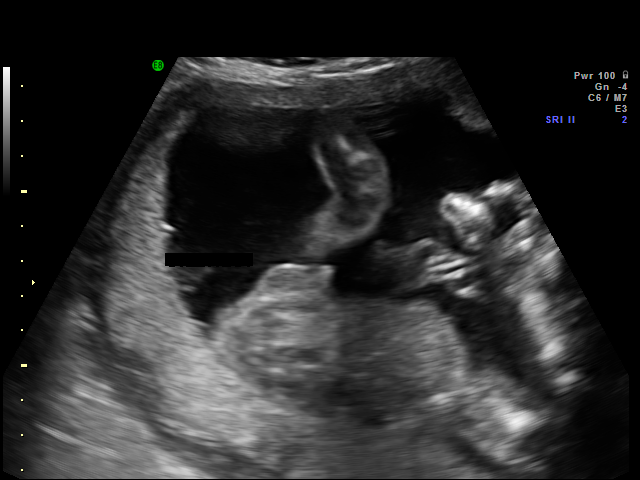
[im 26/69]
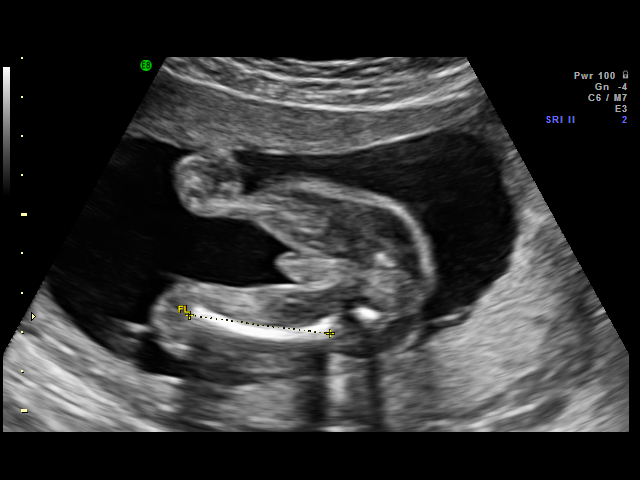
[im 28/69]
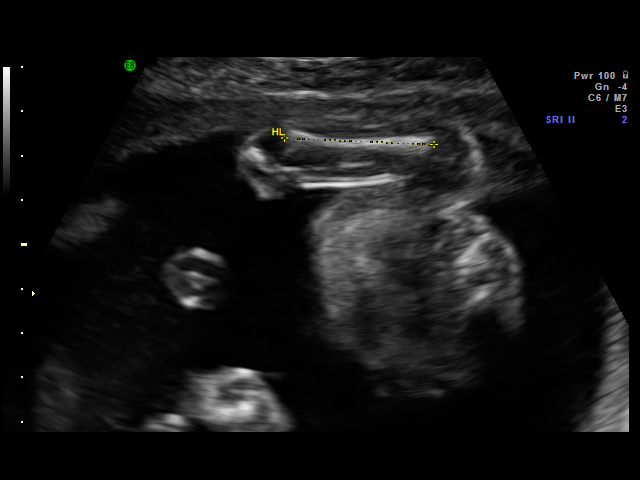
[im 33/69]
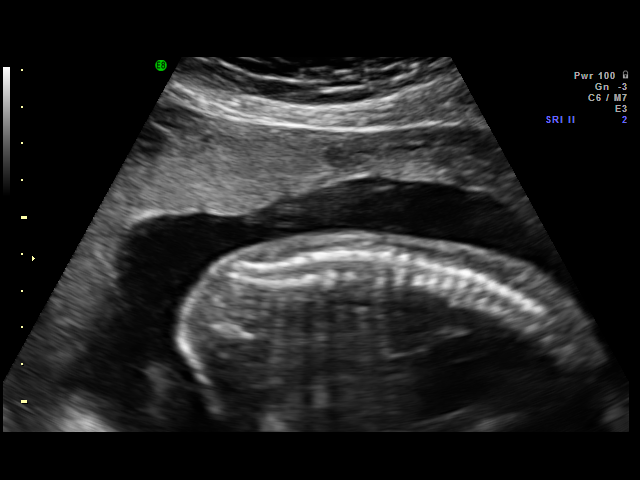
[im 36/69]
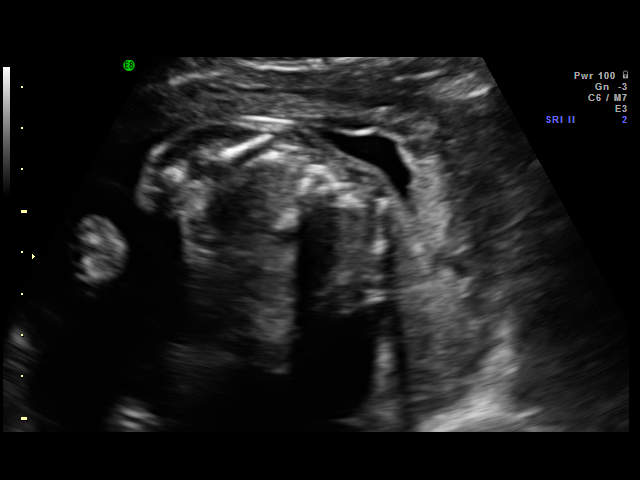
[im 41/69]
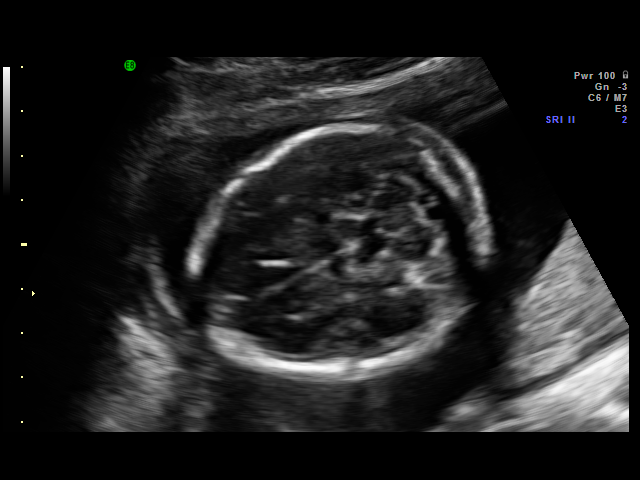
[im 43/69]
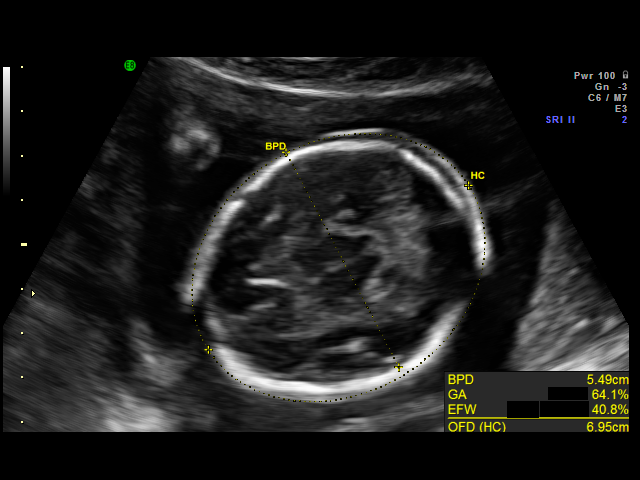
[im 48/69]
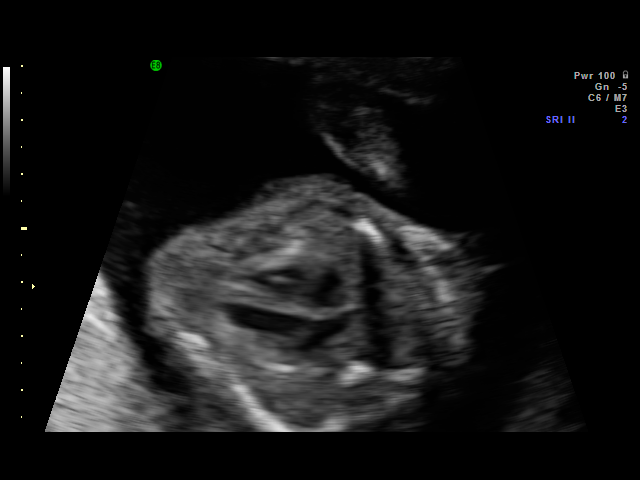
[im 53/69]
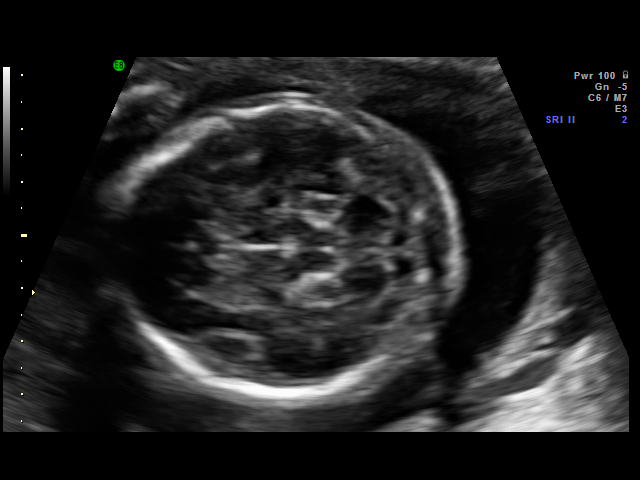
[im 56/69]
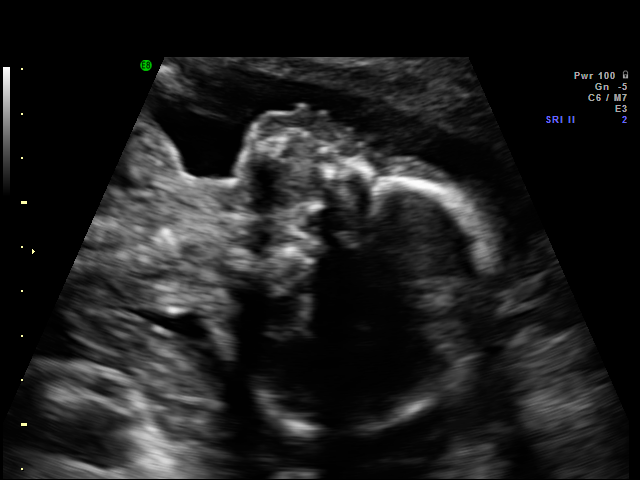
[im 61/69]
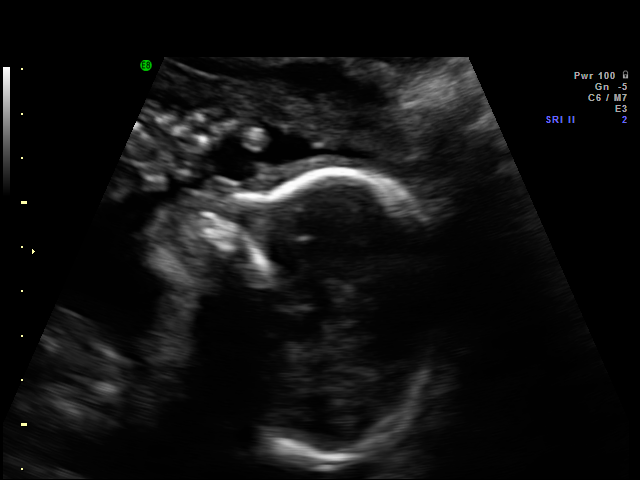
[im 63/69]
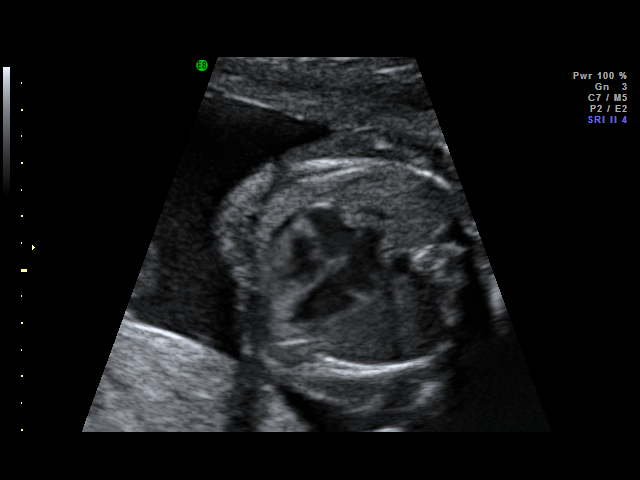
[im 69/69]
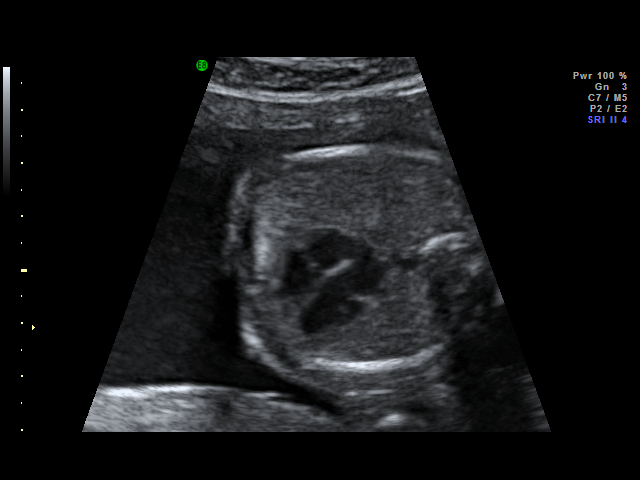

[18 of 28 positions shown; findings below may reference images not displayed]

IMPRESSION: [facility] has also been faxed to the ordering physician.

## 2014-01-17 ENCOUNTER — Encounter (HOSPITAL_COMMUNITY): Payer: Self-pay | Admitting: Obstetrics and Gynecology

## 2015-03-07 LAB — OB RESULTS CONSOLE HIV ANTIBODY (ROUTINE TESTING): HIV: NONREACTIVE

## 2015-03-07 LAB — OB RESULTS CONSOLE GC/CHLAMYDIA
CHLAMYDIA, DNA PROBE: NEGATIVE
Gonorrhea: NEGATIVE

## 2015-03-07 LAB — OB RESULTS CONSOLE ABO/RH: RH TYPE: NEGATIVE

## 2015-03-07 LAB — OB RESULTS CONSOLE ANTIBODY SCREEN: ANTIBODY SCREEN: NEGATIVE

## 2015-03-07 LAB — OB RESULTS CONSOLE RUBELLA ANTIBODY, IGM: Rubella: IMMUNE

## 2015-03-07 LAB — OB RESULTS CONSOLE RPR: RPR: NONREACTIVE

## 2015-03-07 LAB — OB RESULTS CONSOLE HEPATITIS B SURFACE ANTIGEN: HEP B S AG: NEGATIVE

## 2015-08-31 ENCOUNTER — Other Ambulatory Visit: Payer: Self-pay | Admitting: Obstetrics and Gynecology

## 2015-09-06 LAB — OB RESULTS CONSOLE GBS: GBS: NEGATIVE

## 2015-09-20 ENCOUNTER — Encounter (HOSPITAL_COMMUNITY): Payer: Self-pay

## 2015-09-20 ENCOUNTER — Telehealth (HOSPITAL_COMMUNITY): Payer: Self-pay | Admitting: *Deleted

## 2015-09-20 NOTE — Telephone Encounter (Signed)
Preadmission screen  

## 2015-09-21 ENCOUNTER — Telehealth (HOSPITAL_COMMUNITY): Payer: Self-pay | Admitting: *Deleted

## 2015-09-21 NOTE — Telephone Encounter (Signed)
Preadmission screen  

## 2015-09-22 ENCOUNTER — Telehealth (HOSPITAL_COMMUNITY): Payer: Self-pay | Admitting: *Deleted

## 2015-09-22 NOTE — Telephone Encounter (Signed)
Preadmission screen  

## 2015-09-25 ENCOUNTER — Encounter (HOSPITAL_COMMUNITY): Payer: Self-pay

## 2015-09-26 ENCOUNTER — Encounter (HOSPITAL_COMMUNITY)
Admission: RE | Admit: 2015-09-26 | Discharge: 2015-09-26 | Disposition: A | Discharge status: Home / Self Care | Payer: BLUE CROSS/BLUE SHIELD | Source: Ambulatory Visit | Attending: Obstetrics and Gynecology | Admitting: Obstetrics and Gynecology

## 2015-09-26 ENCOUNTER — Encounter (HOSPITAL_COMMUNITY): Payer: Self-pay

## 2015-09-26 HISTORY — DX: Unspecified abnormal cytological findings in specimens from vagina: R87.629

## 2015-09-26 HISTORY — DX: Hypothyroidism, unspecified: E03.9

## 2015-09-26 HISTORY — DX: Carpal tunnel syndrome, unspecified upper limb: G56.00

## 2015-09-26 HISTORY — DX: Other specified pregnancy related conditions, unspecified trimester: O26.899

## 2015-09-26 LAB — CBC
HCT: 38.1 % (ref 36.0–46.0)
Hemoglobin: 12.9 g/dL (ref 12.0–15.0)
MCH: 29.8 pg (ref 26.0–34.0)
MCHC: 33.9 g/dL (ref 30.0–36.0)
MCV: 88 fL (ref 78.0–100.0)
PLATELETS: 196 10*3/uL (ref 150–400)
RBC: 4.33 MIL/uL (ref 3.87–5.11)
RDW: 13.6 % (ref 11.5–15.5)
WBC: 9.1 10*3/uL (ref 4.0–10.5)

## 2015-09-26 LAB — TYPE AND SCREEN
ABO/RH(D): O NEG
Antibody Screen: NEGATIVE

## 2015-09-26 NOTE — Patient Instructions (Signed)
20 Miguel Dibblengela Kumpf  09/26/2015   Your procedure is scheduled on:  09/27/2015  [facility] at 1130 AM.  Pick up the phone at the desk and dial [phone].   Call this number if you have problems the morning of surgery: [phone]   Remember:   Do not eat food:After Midnight.  Do not drink clear liquids: After Midnight.  Take these medicines the morning of surgery with A SIP OF WATER: take synthroid   Do not wear jewelry, make-up or nail polish.  Do not wear lotions, powders, or perfumes. You may wear deodorant.  Do not shave 48 hours prior to surgery.  Do not bring valuables to the hospital.  [facility] is not   responsible for any belongings or valuables brought to the hospital.  Contacts, dentures or bridgework may not be worn into surgery.  Leave suitcase in the car. After surgery it may be brought to your room.  For patients admitted to the hospital, checkout time is 11:00 AM the day of              discharge.   Patients discharged the day of surgery will not be allowed to drive             home.  Name and phone number of your driver: na  Special Instructions:   N/A   Please read over the following fact sheets that you were given:   Surgical Site Infection Prevention

## 2015-09-26 NOTE — H&P (Signed)
Melody Turner is a [age] y.o. female presenting for rpt csection.  Maternal Medical History:  Contractions: Frequency: regular.   Perceived severity is mild.    Fetal activity: Perceived fetal activity is normal.   Last perceived fetal movement was within the past hour.    Prenatal complications: no prenatal complications Prenatal Complications - Diabetes: none.    OB History    Gravida Para Term Preterm AB TAB SAB Ectopic Multiple Living   3 2 2       2      Past Medical History  Diagnosis Date  . Postpartum care following cesarean delivery 02/01/2011  . Carpal tunnel syndrome during pregnancy   . Vaginal Pap smear, abnormal   . Hypothyroidism    Past Surgical History  Procedure Laterality Date  . Cesarean section    . No past surgeries    . Cesarean section  01/30/2011    Procedure: CESAREAN SECTION;  Surgeon: Lenoard Adenichard J Liese Dizdarevic, MD;  Location: [facility];  Service: Gynecology;  Laterality: N/A;  Baby Girl @ [phone], Apgars 8/9   Family History: family history includes Cancer in her paternal grandmother; Hypertension in her father. Social History:  reports that she has never smoked. She has never used smokeless tobacco. She reports that she does not drink alcohol or use illicit drugs.   Prenatal Transfer Tool  Maternal Diabetes: No Genetic Screening: Normal Maternal Ultrasounds/Referrals: Normal Fetal Ultrasounds or other Referrals:  None Maternal Substance Abuse:  No Significant Maternal Medications:  None Significant Maternal Lab Results:  None Other Comments:  None  Review of Systems  Constitutional: Negative.   All other systems reviewed and are negative.     unknown if currently breastfeeding. Maternal Exam:  Uterine Assessment: Contraction strength is mild.  Contraction frequency is rare.   Abdomen: Patient reports no abdominal tenderness. Surgical scars: low transverse.   Fetal presentation: vertex  Introitus: Normal vulva. Normal vagina.  Ferning test: not done.   Nitrazine test: not done. Amniotic fluid character: not assessed.  Pelvis: questionable for delivery.   Cervix: Cervix evaluated by digital exam.     Physical Exam  Nursing note and vitals reviewed. Constitutional: She is oriented to person, place, and time. She appears well-developed and well-nourished.  HENT:  Head: Normocephalic and atraumatic.  Neck: Normal range of motion. Neck supple.  Cardiovascular: Normal rate and regular rhythm.   Respiratory: Effort normal and breath sounds normal.  GI: Soft. Bowel sounds are normal.  Genitourinary: Vagina normal and uterus normal.  Musculoskeletal: Normal range of motion.  Neurological: She is alert and oriented to person, place, and time. She has normal reflexes.  Skin: Skin is warm.  Psychiatric: She has a normal mood and affect.    Prenatal labs: ABO, Rh: --/--/O NEG (07/11 0945) Antibody: NEG (07/11 0945) Rubella: Immune (12/20 0000) RPR: Nonreactive (12/20 0000)  HBsAg: Negative (12/20 0000)  HIV: Non-reactive (12/20 0000)  GBS: Negative (06/21 0000)   Assessment/Plan: 39 wk IUP Previous csection x 2 Rpt csection. RIsks vs benefits discussed. Consent done.   Monique Hefty J 09/26/2015, 8:55 PM

## 2015-09-27 ENCOUNTER — Inpatient Hospital Stay (HOSPITAL_COMMUNITY): Payer: BLUE CROSS/BLUE SHIELD | Admitting: Anesthesiology

## 2015-09-27 ENCOUNTER — Encounter (HOSPITAL_COMMUNITY): Payer: Self-pay | Admitting: Anesthesiology

## 2015-09-27 ENCOUNTER — Encounter (HOSPITAL_COMMUNITY)
Admission: RE | Disposition: A | Discharge status: Home / Self Care | Payer: Self-pay | Source: Ambulatory Visit | Attending: Obstetrics and Gynecology

## 2015-09-27 ENCOUNTER — Inpatient Hospital Stay (HOSPITAL_COMMUNITY)
Admission: RE | Admit: 2015-09-27 | Discharge: 2015-09-29 | DRG: 766 | Disposition: A | Discharge status: Home / Self Care | Payer: BLUE CROSS/BLUE SHIELD | Source: Ambulatory Visit | Attending: Obstetrics and Gynecology | Admitting: Obstetrics and Gynecology

## 2015-09-27 DIAGNOSIS — O34219 Maternal care for unspecified type scar from previous cesarean delivery: Secondary | ICD-10-CM | POA: Diagnosis present

## 2015-09-27 DIAGNOSIS — E039 Hypothyroidism, unspecified: Secondary | ICD-10-CM | POA: Diagnosis present

## 2015-09-27 DIAGNOSIS — Z6791 Unspecified blood type, Rh negative: Secondary | ICD-10-CM | POA: Diagnosis not present

## 2015-09-27 DIAGNOSIS — O99284 Endocrine, nutritional and metabolic diseases complicating childbirth: Secondary | ICD-10-CM | POA: Diagnosis present

## 2015-09-27 DIAGNOSIS — Z8249 Family history of ischemic heart disease and other diseases of the circulatory system: Secondary | ICD-10-CM

## 2015-09-27 DIAGNOSIS — Z3A39 39 weeks gestation of pregnancy: Secondary | ICD-10-CM | POA: Diagnosis not present

## 2015-09-27 DIAGNOSIS — O34211 Maternal care for low transverse scar from previous cesarean delivery: Secondary | ICD-10-CM | POA: Diagnosis present

## 2015-09-27 DIAGNOSIS — O26893 Other specified pregnancy related conditions, third trimester: Secondary | ICD-10-CM | POA: Diagnosis present

## 2015-09-27 LAB — RPR: RPR Ser Ql: NONREACTIVE

## 2015-09-27 SURGERY — Surgical Case
Anesthesia: Spinal

## 2015-09-27 MED ORDER — ONDANSETRON HCL 4 MG/2ML IJ SOLN
INTRAMUSCULAR | Status: AC
  Filled 2015-09-27: qty 2

## 2015-09-27 MED ORDER — DIPHENHYDRAMINE HCL 25 MG PO CAPS
25.0000 mg | ORAL_CAPSULE | ORAL | Status: DC | PRN
  Filled 2015-09-27: qty 1

## 2015-09-27 MED ORDER — FENTANYL CITRATE (PF) 100 MCG/2ML IJ SOLN
INTRAMUSCULAR | Status: DC | PRN
  Administered 2015-09-27: 20 ug via INTRATHECAL

## 2015-09-27 MED ORDER — NALOXONE HCL 2 MG/2ML IJ SOSY
1.0000 ug/kg/h | PREFILLED_SYRINGE | INTRAMUSCULAR | Status: DC | PRN
  Filled 2015-09-27: qty 2

## 2015-09-27 MED ORDER — MORPHINE SULFATE (PF) 0.5 MG/ML IJ SOLN
INTRAMUSCULAR | Status: AC
  Filled 2015-09-27: qty 10

## 2015-09-27 MED ORDER — FENTANYL CITRATE (PF) 100 MCG/2ML IJ SOLN
INTRAMUSCULAR | Status: AC
  Filled 2015-09-27: qty 2

## 2015-09-27 MED ORDER — LACTATED RINGERS IV SOLN
INTRAVENOUS | Status: DC
  Administered 2015-09-27 (×2): via INTRAVENOUS

## 2015-09-27 MED ORDER — NALOXONE HCL 0.4 MG/ML IJ SOLN: 0.4000 mg | INTRAMUSCULAR | Status: DC | PRN

## 2015-09-27 MED ORDER — PHENYLEPHRINE 8 MG IN D5W 100 ML (0.08MG/ML) PREMIX OPTIME
INJECTION | INTRAVENOUS | Status: DC | PRN
  Administered 2015-09-27: 60 ug/min via INTRAVENOUS

## 2015-09-27 MED ORDER — METHYLERGONOVINE MALEATE 0.2 MG/ML IJ SOLN: 0.2000 mg | INTRAMUSCULAR | Status: AC | PRN

## 2015-09-27 MED ORDER — NALBUPHINE HCL 10 MG/ML IJ SOLN: 5.0000 mg | Freq: Once | INTRAMUSCULAR | Status: DC | PRN

## 2015-09-27 MED ORDER — MENTHOL 3 MG MT LOZG: 1.0000 | LOZENGE | OROMUCOSAL | Status: DC | PRN

## 2015-09-27 MED ORDER — MEPERIDINE HCL 25 MG/ML IJ SOLN
6.2500 mg | INTRAMUSCULAR | Status: DC | PRN
Start: 2015-09-27 — End: 2015-09-27

## 2015-09-27 MED ORDER — KETOROLAC TROMETHAMINE 30 MG/ML IJ SOLN: 30.0000 mg | Freq: Four times a day (QID) | INTRAMUSCULAR | Status: AC | PRN

## 2015-09-27 MED ORDER — KETOROLAC TROMETHAMINE 30 MG/ML IJ SOLN
30.0000 mg | Freq: Four times a day (QID) | INTRAMUSCULAR | Status: AC | PRN
  Administered 2015-09-27: 30 mg via INTRAMUSCULAR

## 2015-09-27 MED ORDER — SIMETHICONE 80 MG PO CHEW: 80.0000 mg | CHEWABLE_TABLET | ORAL | Status: DC | PRN

## 2015-09-27 MED ORDER — BUPIVACAINE IN DEXTROSE 0.75-8.25 % IT SOLN
INTRATHECAL | Status: DC | PRN
Start: 2015-09-27 — End: 2015-09-27
  Administered 2015-09-27: 1.4 mL via INTRATHECAL

## 2015-09-27 MED ORDER — NALBUPHINE HCL 10 MG/ML IJ SOLN: 5.0000 mg | INTRAMUSCULAR | Status: DC | PRN

## 2015-09-27 MED ORDER — ACETAMINOPHEN 325 MG PO TABS
650.0000 mg | ORAL_TABLET | ORAL | Status: DC | PRN
Start: 2015-09-27 — End: 2015-09-29

## 2015-09-27 MED ORDER — IBUPROFEN 600 MG PO TABS: 600.0000 mg | ORAL_TABLET | Freq: Four times a day (QID) | ORAL | Status: DC | PRN

## 2015-09-27 MED ORDER — TETANUS-DIPHTH-ACELL PERTUSSIS 5-2.5-18.5 LF-MCG/0.5 IM SUSP: 0.5000 mL | Freq: Once | INTRAMUSCULAR | Status: DC

## 2015-09-27 MED ORDER — BUPIVACAINE HCL (PF) 0.25 % IJ SOLN
INTRAMUSCULAR | Status: DC | PRN
  Administered 2015-09-27: 30 mL

## 2015-09-27 MED ORDER — ONDANSETRON HCL 4 MG/2ML IJ SOLN
INTRAMUSCULAR | Status: DC | PRN
  Administered 2015-09-27: 4 mg via INTRAVENOUS

## 2015-09-27 MED ORDER — OXYTOCIN 10 UNIT/ML IJ SOLN
40.0000 [IU] | INTRAMUSCULAR | Status: DC | PRN
  Administered 2015-09-27: 40 [IU] via INTRAVENOUS

## 2015-09-27 MED ORDER — ZOLPIDEM TARTRATE 5 MG PO TABS: 5.0000 mg | ORAL_TABLET | Freq: Every evening | ORAL | Status: DC | PRN

## 2015-09-27 MED ORDER — OXYTOCIN 10 UNIT/ML IJ SOLN
INTRAMUSCULAR | Status: AC
  Filled 2015-09-27: qty 4

## 2015-09-27 MED ORDER — WITCH HAZEL-GLYCERIN EX PADS: 1.0000 "application " | MEDICATED_PAD | CUTANEOUS | Status: DC | PRN

## 2015-09-27 MED ORDER — BUPIVACAINE HCL (PF) 0.25 % IJ SOLN
INTRAMUSCULAR | Status: AC
Start: 2015-09-27 — End: 2015-09-27
  Filled 2015-09-27: qty 30

## 2015-09-27 MED ORDER — SCOPOLAMINE 1 MG/3DAYS TD PT72
1.0000 | MEDICATED_PATCH | Freq: Once | TRANSDERMAL | Status: DC
  Administered 2015-09-27: 1.5 mg via TRANSDERMAL

## 2015-09-27 MED ORDER — KETOROLAC TROMETHAMINE 30 MG/ML IJ SOLN
INTRAMUSCULAR | Status: AC
  Filled 2015-09-27: qty 1

## 2015-09-27 MED ORDER — MORPHINE SULFATE (PF) 0.5 MG/ML IJ SOLN
INTRAMUSCULAR | Status: DC | PRN
  Administered 2015-09-27: .2 mg via INTRATHECAL

## 2015-09-27 MED ORDER — DIBUCAINE 1 % RE OINT: 1.0000 "application " | TOPICAL_OINTMENT | RECTAL | Status: DC | PRN

## 2015-09-27 MED ORDER — SIMETHICONE 80 MG PO CHEW
80.0000 mg | CHEWABLE_TABLET | ORAL | Status: DC
  Administered 2015-09-28 (×2): 80 mg via ORAL
  Filled 2015-09-27 (×2): qty 1

## 2015-09-27 MED ORDER — METHYLERGONOVINE MALEATE 0.2 MG PO TABS
ORAL_TABLET | ORAL | Status: AC
  Filled 2015-09-27: qty 1

## 2015-09-27 MED ORDER — SODIUM CHLORIDE 0.9 % IR SOLN
Status: DC | PRN
Start: 2015-09-27 — End: 2015-09-27
  Administered 2015-09-27: 1000 mL

## 2015-09-27 MED ORDER — OXYTOCIN 40 UNITS IN LACTATED RINGERS INFUSION - SIMPLE MED
2.5000 [IU]/h | INTRAVENOUS | Status: AC
  Administered 2015-09-27: 2.5 [IU]/h via INTRAVENOUS
  Filled 2015-09-27: qty 1000

## 2015-09-27 MED ORDER — DIPHENHYDRAMINE HCL 25 MG PO CAPS: 25.0000 mg | ORAL_CAPSULE | Freq: Four times a day (QID) | ORAL | Status: DC | PRN

## 2015-09-27 MED ORDER — DIPHENHYDRAMINE HCL 50 MG/ML IJ SOLN: 12.5000 mg | INTRAMUSCULAR | Status: DC | PRN

## 2015-09-27 MED ORDER — LACTATED RINGERS IV SOLN
INTRAVENOUS | Status: DC
  Administered 2015-09-28: 07:00:00 via INTRAVENOUS

## 2015-09-27 MED ORDER — CEFAZOLIN SODIUM-DEXTROSE 2-4 GM/100ML-% IV SOLN
2.0000 g | INTRAVENOUS | Status: AC
  Administered 2015-09-27: 2 g via INTRAVENOUS

## 2015-09-27 MED ORDER — LACTATED RINGERS IV SOLN
Freq: Once | INTRAVENOUS | Status: AC
  Administered 2015-09-27: 12:00:00 via INTRAVENOUS

## 2015-09-27 MED ORDER — ACETAMINOPHEN 500 MG PO TABS
1000.0000 mg | ORAL_TABLET | Freq: Four times a day (QID) | ORAL | Status: AC
  Administered 2015-09-27 – 2015-09-28 (×3): 1000 mg via ORAL
  Filled 2015-09-27 (×3): qty 2

## 2015-09-27 MED ORDER — LEVOTHYROXINE SODIUM 175 MCG PO TABS
175.0000 ug | ORAL_TABLET | Freq: Every day | ORAL | Status: DC
  Administered 2015-09-28 – 2015-09-29 (×2): 175 ug via ORAL
  Filled 2015-09-27 (×2): qty 1

## 2015-09-27 MED ORDER — METHYLERGONOVINE MALEATE 0.2 MG PO TABS
0.2000 mg | ORAL_TABLET | ORAL | Status: AC | PRN
  Administered 2015-09-27 – 2015-09-28 (×5): 0.2 mg via ORAL
  Filled 2015-09-27 (×5): qty 1

## 2015-09-27 MED ORDER — PHENYLEPHRINE 8 MG IN D5W 100 ML (0.08MG/ML) PREMIX OPTIME
INJECTION | INTRAVENOUS | Status: AC
  Filled 2015-09-27: qty 100

## 2015-09-27 MED ORDER — SIMETHICONE 80 MG PO CHEW
80.0000 mg | CHEWABLE_TABLET | Freq: Three times a day (TID) | ORAL | Status: DC
  Administered 2015-09-28 – 2015-09-29 (×4): 80 mg via ORAL
  Filled 2015-09-27 (×4): qty 1

## 2015-09-27 MED ORDER — SCOPOLAMINE 1 MG/3DAYS TD PT72
MEDICATED_PATCH | TRANSDERMAL | Status: AC
  Administered 2015-09-27: 1.5 mg via TRANSDERMAL
  Filled 2015-09-27: qty 1

## 2015-09-27 MED ORDER — IBUPROFEN 600 MG PO TABS
600.0000 mg | ORAL_TABLET | Freq: Four times a day (QID) | ORAL | Status: DC
  Administered 2015-09-28 – 2015-09-29 (×6): 600 mg via ORAL
  Filled 2015-09-27 (×6): qty 1

## 2015-09-27 MED ORDER — SENNOSIDES-DOCUSATE SODIUM 8.6-50 MG PO TABS
2.0000 | ORAL_TABLET | ORAL | Status: DC
  Administered 2015-09-28 (×2): 2 via ORAL
  Filled 2015-09-27 (×3): qty 2

## 2015-09-27 MED ORDER — PRENATAL MULTIVITAMIN CH
1.0000 | ORAL_TABLET | Freq: Every day | ORAL | Status: DC
  Administered 2015-09-28: 1 via ORAL
  Filled 2015-09-27: qty 1

## 2015-09-27 MED ORDER — LACTATED RINGERS IV SOLN
INTRAVENOUS | Status: DC | PRN
  Administered 2015-09-27: 13:00:00 via INTRAVENOUS

## 2015-09-27 MED ORDER — ONDANSETRON HCL 4 MG/2ML IJ SOLN: 4.0000 mg | Freq: Three times a day (TID) | INTRAMUSCULAR | Status: DC | PRN

## 2015-09-27 MED ORDER — SODIUM CHLORIDE 0.9% FLUSH
3.0000 mL | INTRAVENOUS | Status: DC | PRN
Start: 2015-09-27 — End: 2015-09-29

## 2015-09-27 MED ORDER — COCONUT OIL OIL: 1.0000 "application " | TOPICAL_OIL | Status: DC | PRN

## 2015-09-27 MED ORDER — FENTANYL CITRATE (PF) 100 MCG/2ML IJ SOLN: 25.0000 ug | INTRAMUSCULAR | Status: DC | PRN

## 2015-09-27 SURGICAL SUPPLY — 36 items
BENZOIN TINCTURE PRP APPL 2/3 (GAUZE/BANDAGES/DRESSINGS) ×3 IMPLANT
CHLORAPREP W/TINT 26ML (MISCELLANEOUS) ×3 IMPLANT
CLAMP CORD UMBIL (MISCELLANEOUS) IMPLANT
CLOSURE STERI STRIP 1/2 X4 (GAUZE/BANDAGES/DRESSINGS) ×3 IMPLANT
CLOTH BEACON ORANGE TIMEOUT ST (SAFETY) ×3 IMPLANT
CONTAINER PREFILL 10% NBF 15ML (MISCELLANEOUS) IMPLANT
DECANTER SPIKE VIAL GLASS SM (MISCELLANEOUS) ×3 IMPLANT
DRSG OPSITE POSTOP 4X10 (GAUZE/BANDAGES/DRESSINGS) ×3 IMPLANT
ELECT REM PT RETURN 9FT ADLT (ELECTROSURGICAL) ×3
ELECTRODE REM PT RTRN 9FT ADLT (ELECTROSURGICAL) ×1 IMPLANT
EXTRACTOR VACUUM M CUP 4 TUBE (SUCTIONS) ×2 IMPLANT
EXTRACTOR VACUUM M CUP 4' TUBE (SUCTIONS) ×1
GLOVE BIO SURGEON STRL SZ7.5 (GLOVE) ×3 IMPLANT
GLOVE BIOGEL PI IND STRL 7.0 (GLOVE) ×1 IMPLANT
GLOVE BIOGEL PI INDICATOR 7.0 (GLOVE) ×2
GOWN STRL REUS W/TWL LRG LVL3 (GOWN DISPOSABLE) ×6 IMPLANT
KIT ABG SYR 3ML LUER SLIP (SYRINGE) IMPLANT
NEEDLE HYPO 22GX1.5 SAFETY (NEEDLE) ×3 IMPLANT
NEEDLE HYPO 25X5/8 SAFETYGLIDE (NEEDLE) IMPLANT
NEEDLE SPNL 20GX3.5 QUINCKE YW (NEEDLE) IMPLANT
NS IRRIG 1000ML POUR BTL (IV SOLUTION) ×3 IMPLANT
PACK C SECTION WH (CUSTOM PROCEDURE TRAY) ×3 IMPLANT
PENCIL SMOKE EVAC W/HOLSTER (ELECTROSURGICAL) ×3 IMPLANT
SUT MNCRL 0 VIOLET CTX 36 (SUTURE) ×2 IMPLANT
SUT MNCRL AB 3-0 PS2 27 (SUTURE) ×3 IMPLANT
SUT MON AB 2-0 CT1 27 (SUTURE) ×3 IMPLANT
SUT MON AB-0 CT1 36 (SUTURE) ×6 IMPLANT
SUT MONOCRYL 0 CTX 36 (SUTURE) ×4
SUT PLAIN 0 NONE (SUTURE) IMPLANT
SUT PLAIN 2 0 (SUTURE)
SUT PLAIN 2 0 XLH (SUTURE) IMPLANT
SUT PLAIN ABS 2-0 CT1 27XMFL (SUTURE) IMPLANT
SYR 20CC LL (SYRINGE) IMPLANT
SYR CONTROL 10ML LL (SYRINGE) ×3 IMPLANT
TOWEL OR 17X24 6PK STRL BLUE (TOWEL DISPOSABLE) ×3 IMPLANT
TRAY FOLEY CATH SILVER 14FR (SET/KITS/TRAYS/PACK) ×3 IMPLANT

## 2015-09-27 NOTE — Addendum Note (Signed)
Addendum  created 09/27/15 1852 by Yolonda KidaAlison L Kelse Ploch, CRNA   Modules edited: Clinical Notes   Clinical Notes:  File: [id number]

## 2015-09-27 NOTE — Op Note (Signed)
Cesarean Section Procedure Note  Indications: previous uterine incision kerr x2  Pre-operative Diagnosis: 39 week 1 day pregnancy.  Post-operative Diagnosis: same  Surgeon: Lenoard AdenAAVON,Amdrew Oboyle J   Assistants: Arita Missawson, CNM  Anesthesia: Local anesthesia 0.25.% bupivacaine and Spinal anesthesia  ASA Class: 2  Procedure Details  The patient was seen in the Holding Room. The risks, benefits, complications, treatment options, and expected outcomes were discussed with the patient.  The patient concurred with the proposed plan, giving informed consent. The risks of anesthesia, infection, bleeding and possible injury to other organs discussed. Injury to bowel, bladder, or ureter with possible need for repair discussed. Possible need for transfusion with secondary risks of hepatitis or HIV acquisition discussed. Post operative complications to include but not limited to DVT, PE and Pneumonia noted. The site of surgery properly noted/marked. The patient was taken to Operating Room # 9, identified as Melody Turner and the procedure verified as C-Section Delivery. A Time Out was held and the above information confirmed.  After induction of anesthesia, the patient was draped and prepped in the usual sterile manner. A Pfannenstiel incision was made and carried down through the subcutaneous tissue to the fascia. Fascial incision was made and extended transversely using Mayo scissors. The fascia was separated from the underlying rectus tissue superiorly and inferiorly. The peritoneum was identified and entered. Peritoneal incision was extended longitudinally. The utero-vesical peritoneal reflection was incised transversely and the bladder flap was sharply freed from the lower uterine segment. A low transverse uterine incision(Kerr hysterotomy) was made. Delivered from 9 presentation was a  female with Apgar scores of 9 at one minute and 9 at five minutes. Bulb suctioning gently performed. Neonatal team in attendance.After  the umbilical cord was clamped and cut cord blood was obtained for evaluation. The placenta was removed intact from posterior location and appeared normal. The uterus was curetted with a dry lap pack. Good hemostasis was noted.The uterine outline, tubes and ovaries appeared normal. The uterine incision was closed with running locked sutures of 0 Monocryl x 2 layers. Hemostasis was observed. The parietal peritoneum was closed with a running 2-0 Monocryl suture. The fascia was then reapproximated with running sutures of 0 Monocryl. The skin was reapproximated with 3-0 monocryl after [place] closure with 2-0 plain.  Instrument, sponge, and needle counts were correct prior the abdominal closure and at the conclusion of the case.   Findings: FTLM, post placenta, nl adnexa  Estimated Blood Loss:  400         Drains: foley                 Specimens: placenta                 Complications:  None; patient tolerated the procedure well.         Disposition: PACU - hemodynamically stable.         Condition: stable  Attending Attestation: I performed the procedure.

## 2015-09-27 NOTE — Anesthesia Postprocedure Evaluation (Addendum)
Anesthesia Post Note  Patient: Melody Turner  Procedure(s) Performed: Procedure(s) (LRB): Repeat CESAREAN SECTION (N/A)  Patient location during evaluation: PACU Anesthesia Type: Spinal Level of consciousness: awake and alert and oriented Pain management: pain level controlled Vital Signs Assessment: post-procedure vital signs reviewed and stable Respiratory status: spontaneous breathing, nonlabored ventilation and respiratory function stable Cardiovascular status: blood pressure returned to baseline and stable Postop Assessment: no signs of nausea or vomiting, patient able to bend at knees, spinal receding, no backache and no headache Anesthetic complications: no     Last Vitals:  Filed Vitals:   09/27/15 1445 09/27/15 1500  BP: 101/63 100/63  Pulse: 73 76  Temp:    Resp: 22 17    Last Pain:  Filed Vitals:   09/27/15 1511  PainSc: 1    Pain Goal: Patients Stated Pain Goal: 4 (09/27/15 1134)               Blaike Newburn A.

## 2015-09-27 NOTE — Transfer of Care (Signed)
Immediate Anesthesia Transfer of Care Note  Patient: Melody Turner  Procedure(s) Performed: Procedure(s) with comments: Repeat CESAREAN SECTION (N/A) - EDD: 10/03/15  Patient Location: PACU  Anesthesia Type:Spinal  Level of Consciousness: awake, alert  and oriented  Airway & Oxygen Therapy: Patient Spontanous Breathing  Post-op Assessment: Report given to RN and Post -op Vital signs reviewed and stable  Post vital signs: Reviewed and stable  Last Vitals:  Filed Vitals:   09/27/15 1134  BP: 119/84  Pulse: 67  Temp: 36.3 C  Resp: 16    Last Pain: There were no vitals filed for this visit.    Patients Stated Pain Goal: 4 (09/27/15 1134)  Complications: No apparent anesthesia complications

## 2015-09-27 NOTE — Lactation Note (Signed)
This note was copied from a baby's chart. Lactation Consultation Note  Patient Name: Melody Turner ZOXWR'UToday's Date: 09/27/2015 Reason for consult: Initial assessment Baby at 4 hr of life. Experienced bf mom reports feedings are going well. She denies breast or nipple pain, voiced no concerns. She bf her other 2 children with no issues until she went back to work. Discussed baby behavior, feeding frequency, baby belly size, voids, wt loss, breast changes, and nipple care. She stated she can manually express and has spoon in room. Given lactation handouts. Aware of OP services and support group. She will call as needed.     Maternal Data Has patient been taught Hand Expression?: Yes Does the patient have breastfeeding experience prior to this delivery?: Yes  Feeding Feeding Type: Breast Fed Length of feed: 30 min  LATCH Score/Interventions Latch: Grasps breast easily, tongue down, lips flanged, rhythmical sucking.  Audible Swallowing: A few with stimulation Intervention(s): Skin to skin;Hand expression  Type of Nipple: Everted at rest and after stimulation  Comfort (Breast/Nipple): Soft / non-tender     Hold (Positioning): Assistance needed to correctly position infant at breast and maintain latch.  LATCH Score: 8  Lactation Tools Discussed/Used WIC Program: No   Consult Status Consult Status: Follow-up Date: 09/28/15 Follow-up type: In-patient    Melody Turner 09/27/2015, 5:26 PM

## 2015-09-27 NOTE — Progress Notes (Signed)
This note also relates to the following rows which could not be included: BP - Cannot attach notes to unvalidated device data Pulse Rate - Cannot attach notes to unvalidated device data Resp - Cannot attach notes to unvalidated device data SpO2 - Cannot attach notes to unvalidated device data   Contacted Dr Billy Coastaavon, concerning pt bleeding, will begin methergine protocol

## 2015-09-27 NOTE — Anesthesia Preprocedure Evaluation (Addendum)
Anesthesia Evaluation  Patient identified by MRN, date of birth, ID band Patient awake    Reviewed: Allergy & Precautions, NPO status , Patient's Chart, lab work & pertinent test results  Airway Mallampati: I  TM Distance: >3 FB Neck ROM: Full    Dental no notable dental hx. (+) Teeth Intact   Pulmonary neg pulmonary ROS,    Pulmonary exam normal breath sounds clear to auscultation       Cardiovascular negative cardio ROS Normal cardiovascular exam Rhythm:Regular Rate:Normal     Neuro/Psych  Neuromuscular disease negative psych ROS   GI/Hepatic negative GI ROS, Neg liver ROS,   Endo/Other  Hypothyroidism   Renal/GU negative Renal ROS  negative genitourinary   Musculoskeletal   Abdominal   Peds  Hematology negative hematology ROS (+)   Anesthesia Other Findings   Reproductive/Obstetrics (+) Pregnancy Previous C/Section x 2                            Anesthesia Physical Anesthesia Plan  ASA: II  Anesthesia Plan: Spinal   Post-op Pain Management:    Induction:   Airway Management Planned: Natural Airway  Additional Equipment:   Intra-op Plan:   Post-operative Plan:   Informed Consent: I have reviewed the patients History and Physical, chart, labs and discussed the procedure including the risks, benefits and alternatives for the proposed anesthesia with the patient or authorized representative who has indicated his/her understanding and acceptance.   Dental advisory given  Plan Discussed with: Anesthesiologist, CRNA and Surgeon  Anesthesia Plan Comments:         Anesthesia Quick Evaluation

## 2015-09-27 NOTE — Progress Notes (Signed)
Patient ID: Melody Turner, female   DOB: [birth year], 35 y.o.   MRN: [id number] Patient seen and examined. Consent witnessed and signed. No changes noted. Update completed.

## 2015-09-27 NOTE — Anesthesia Postprocedure Evaluation (Signed)
Anesthesia Post Note  Patient: Melody Turner  Procedure(s) Performed: Procedure(s) (LRB): Repeat CESAREAN SECTION (N/A)  Patient location during evaluation: Mother Baby Anesthesia Type: Spinal Level of consciousness: awake, awake and alert, oriented and patient cooperative Pain management: pain level controlled Vital Signs Assessment: post-procedure vital signs reviewed and stable Respiratory status: spontaneous breathing, nonlabored ventilation and respiratory function stable Cardiovascular status: stable Postop Assessment: no headache, no backache, patient able to bend at knees and no signs of nausea or vomiting Anesthetic complications: no     Last Vitals:  Filed Vitals:   09/27/15 1618 09/27/15 1715  BP: 109/64 105/59  Pulse: 55 59  Temp: 36.7 C 36.3 C  Resp: 18 18    Last Pain:  Filed Vitals:   09/27/15 1721  PainSc: 1    Pain Goal: Patients Stated Pain Goal: 4 (09/27/15 1134)               Hilma Steinhilber L

## 2015-09-28 ENCOUNTER — Encounter (HOSPITAL_COMMUNITY): Payer: Self-pay | Admitting: *Deleted

## 2015-09-28 LAB — CBC
HCT: 37 % (ref 36.0–46.0)
HEMOGLOBIN: 12.6 g/dL (ref 12.0–15.0)
MCH: 30 pg (ref 26.0–34.0)
MCHC: 34.1 g/dL (ref 30.0–36.0)
MCV: 88.1 fL (ref 78.0–100.0)
PLATELETS: 144 10*3/uL — AB (ref 150–400)
RBC: 4.2 MIL/uL (ref 3.87–5.11)
RDW: 13.9 % (ref 11.5–15.5)
WBC: 10.2 10*3/uL (ref 4.0–10.5)

## 2015-09-28 LAB — BIRTH TISSUE RECOVERY COLLECTION (PLACENTA DONATION)

## 2015-09-28 NOTE — Progress Notes (Signed)
Subjective: POD# 1 Information for the patient's newborn:  Melody Turner, Melody Turner [[id number]][[id number]]  female  / circ completed  Reports feeling well, has been up and ambulating. [facility] Feeding: breast Patient reports tolerating PO.  Breast symptoms: none Pain controlled withPO motrin and percocet Denies HA/SOB/C/P/N/V/dizziness. Flatus absent, feels some gas bubbles occasionally. She reports vaginal bleeding as normal, without clots.  She is ambulating, urinating without difficult.     Objective:   VS:  Filed Vitals:   09/27/15 2213 09/28/15 0112 09/28/15 0354 09/28/15 0758  BP: 102/50 104/56 96/50 105/64  Pulse: 82 74 69 66  Temp:  98.8 F (37.1 C) 98.5 F (36.9 C) 97.4 F (36.3 C)  TempSrc:  Oral Oral Oral  Resp:  16 16 18   SpO2:  98% 94% 97%     Intake/Output Summary (Last 24 hours) at 09/28/15 1204 Last data filed at 09/28/15 1044  Gross per 24 hour  Intake   4610 ml  Output   1925 ml  Net   2685 ml        Recent Labs  09/26/15 0945 09/28/15 0538  WBC 9.1 10.2  HGB 12.9 12.6  HCT 38.1 37.0  PLT 196 144*     Blood type: --/--/O NEG (07/11 0945)  Rubella: Immune (12/20 0000)     Physical Exam:  General: alert, cooperative and no distress CV: Regular rate and rhythm Resp: clear Abdomen: soft, mod gas distention, + BS x 4 q Incision: clean, dry and intact Uterine Fundus: firm, below umbilicus, nontender Lochia: minimal Ext: extremities normal, atraumatic, no cyanosis or edema      Assessment/Plan: 35 y.o.   POD# 1. [id number]                  Principal Problem:   Postpartum care following repeat cesarean delivery (7/12) Active Problems:   Previous cesarean delivery affecting pregnancy   Rh neg, baby Rh neg as well, no Rhogam needed  Doing well, stable.               Advance diet as tolerated Ambulate to mobilize gas Routine post-op care Anticipate DC in AM.   Neta Mendsaniela C Glennie Bose, CNM, MSN 09/28/2015, 12:04 PM

## 2015-09-28 NOTE — Lactation Note (Signed)
This note was copied from a baby's chart. Lactation Consultation Note  Patient Name: Melody Turner ZOXWR'UToday's Date: 09/28/2015 Reason for consult: Follow-up assessment Baby at 29 hr of life. Experienced bf mom reports feedings are going well. She denies breast or nipple pain, voiced no concerns. She reports the large space of time that baby was sleeping today was following circumcision. She is aware of lactation services and support group. She will call as needed.   Maternal Data    Feeding Feeding Type: Breast Fed Length of feed: 25 min  LATCH Score/Interventions Latch: Grasps breast easily, tongue down, lips flanged, rhythmical sucking.  Audible Swallowing: A few with stimulation  Type of Nipple: Everted at rest and after stimulation  Comfort (Breast/Nipple): Soft / non-tender     Hold (Positioning): No assistance needed to correctly position infant at breast.  LATCH Score: 9  Lactation Tools Discussed/Used     Consult Status Consult Status: PRN    Rulon Eisenmengerlizabeth E Ariam Mol 09/28/2015, 6:55 PM

## 2015-09-29 MED ORDER — COCONUT OIL OIL: 1.0000 "application " | TOPICAL_OIL | Status: AC | PRN

## 2015-09-29 MED ORDER — ACETAMINOPHEN 325 MG PO TABS: 650.0000 mg | ORAL_TABLET | ORAL | Status: AC | PRN

## 2015-09-29 MED ORDER — IBUPROFEN 600 MG PO TABS: 600.0000 mg | ORAL_TABLET | Freq: Four times a day (QID) | ORAL | Status: AC | PRN

## 2015-09-29 MED ORDER — OXYCODONE-ACETAMINOPHEN 10-325 MG PO TABS: 1.0000 | ORAL_TABLET | ORAL | Status: AC | PRN

## 2015-09-29 MED ORDER — SIMETHICONE 80 MG PO CHEW: 80.0000 mg | CHEWABLE_TABLET | ORAL | Status: AC | PRN

## 2015-09-29 NOTE — Progress Notes (Signed)
Report of infant and mom [facility] to [facility]. Care of pts relinquished to oncoming nurse.

## 2015-09-29 NOTE — Lactation Note (Signed)
This note was copied from a baby's chart. Lactation Consultation Note  Patient Name: [name] ZOXWR'UToday's Date: 09/29/2015 Reason for consult: Follow-up assessment Mom reports baby is nursing well, denies tenderness. Baby at the end of the feeding, discussed with Mom obtaining more depth with latch. Cluster feeding. Engorgement care reviewed if needed. Advised of OP services and support group. Offered to observe latch, Mom declined. Encouraged to call for questions/concerns.   Maternal Data    Feeding Feeding Type: Breast Fed Length of feed: 15 min  LATCH Score/Interventions                      Lactation Tools Discussed/Used     Consult Status Consult Status: Complete Date: 09/29/15 Follow-up type: In-patient    Alfred LevinsGranger, Carder Yin Ann 09/29/2015, 10:33 AM

## 2015-09-29 NOTE — Discharge Summary (Signed)
POSTOPERATIVE DISCHARGE SUMMARY:  Patient ID: Melody Turner MRN: [id number] DOB/AGE: Mar 07, 1981 35 y.o.  Admit date: 09/27/2015 Discharge date:  09/29/15  Admission Diagnoses: Term pregnancy , previous cesarean section x 2, hypothyroidism    Discharge Diagnoses:   Pos-op day 2, stable Term pregnancy delivered Hypothyroidism  Prenatal history: G3P3003   EDC : 10/03/2015, by Other Basis  Prenatal care at [facility] since [redacted] weeks gestation  Prenatal course complicated by previous cesarean x 2, hypothyroidism well controlled  Prenatal Labs: ABO, Rh: O (12/20 0000)  Antibody: NEG (07/11 0945) Rubella: Immune (12/20 0000)  RPR: Non Reactive (07/11 0945)  HBsAg: Negative (12/20 0000)  HIV: Non-reactive (12/20 0000)  GBS: Negative (06/21 0000)  1 hr Glucola : wnl   Medical / Surgical History :  Past medical history:  Past Medical History  Diagnosis Date  . Postpartum care following cesarean delivery 02/01/2011  . Carpal tunnel syndrome during pregnancy   . Vaginal Pap smear, abnormal   . Hypothyroidism   . Postpartum care following repeat cesarean delivery (7/12) 09/27/2015    Past surgical history:  Past Surgical History  Procedure Laterality Date  . Cesarean section    . No past surgeries    . Cesarean section  01/30/2011    Procedure: CESAREAN SECTION;  Surgeon: Lenoard Aden, MD;  Location: [facility];  Service: Gynecology;  Laterality: N/A;  Baby Girl @ [phone], Apgars 8/9  . Cesarean section N/A 09/27/2015    Procedure: Repeat CESAREAN SECTION;  Surgeon: Olivia Mackie, MD;  Location: [facility] BIRTHING SUITES;  Service: Obstetrics;  Laterality: N/A;  EDD: 10/03/15    Family History:  Family History  Problem Relation Age of Onset  . Hypertension Father   . Cancer Paternal Grandmother     breast    Social History:  reports that she has never smoked. She has never used smokeless tobacco. She reports that she does not drink alcohol or use illicit  drugs.   Allergies: Review of patient's allergies indicates no known allergies.    Current Medications at time of admission:  Prescriptions prior to admission  Medication Sig Dispense Refill Last Dose  . levothyroxine (SYNTHROID, LEVOTHROID) 175 MCG tablet Take 175 mcg by mouth daily.  1 09/27/2015 at Unknown time  . prenatal vitamin w/FE, FA (PRENATAL 1 + 1) 27-1 MG TABS Take 1 tablet by mouth daily.    09/26/2015 at Unknown time        Intrapartum Course:   Procedures: Cesarean section delivery of female newborn by Dr Billy Coast  See operative report for further details  Postoperative / postpartum course: unremarkable  Physical Exam:  VSS: Blood pressure 120/57, pulse 69, temperature 98 F (36.7 C), temperature source Oral, resp. rate 18, SpO2 98 %, unknown if currently breastfeeding.   LABS:  Recent Labs  09/28/15 0538  WBC 10.2  HGB 12.6  HCT 37.0  PLT 144*    I&O: I/O last 3 completed shifts: In: 1530 [Other:1530] Out: 1325 [Urine:1325]      Incision:  approximated with sutture / no erythema / no ecchymosis / minimal drainage Honeycomb dressing to be removed post-op day 5  Discharge Instructions:  Discharged Condition: good Activity: pelvic rest, weight lifting and driving restrictions x 2 weeks Diet: routine Medications:    Medication List    TAKE these medications        acetaminophen 325 MG tablet  Commonly known as:  TYLENOL  Take 2 tablets (650 mg total) by mouth every  4 (four) hours as needed (for pain scale < 4).     coconut oil Oil  Apply 1 application topically as needed.     ibuprofen 600 MG tablet  Commonly known as:  ADVIL,MOTRIN  Take 1 tablet (600 mg total) by mouth every 6 (six) hours as needed for mild pain.     levothyroxine 175 MCG tablet  Commonly known as:  SYNTHROID, LEVOTHROID  Take 175 mcg by mouth daily.     oxyCODONE-acetaminophen 10-325 MG tablet  Commonly known as:  PERCOCET  Take 1 tablet by mouth every 4 (four) hours  as needed for pain.     prenatal vitamin w/FE, FA 27-1 MG Tabs tablet  Take 1 tablet by mouth daily.     simethicone 80 MG chewable tablet  Commonly known as:  MYLICON  Chew 1 tablet (80 mg total) by mouth as needed for flatulence.       Condition: stable Postpartum Instructions: refer to practice specific booklet Discharge to: home Disposition: 01-Home or Self Care Follow up :      Follow-up Information    Follow up with Lenoard AdenAAVON,RICHARD J, MD. Schedule an appointment as soon as possible for a visit in 6 weeks.   Specialty:  Obstetrics and Gynecology   Contact information:   [address] [place] [id number] [phone]        Signed: Neta MendsDaniela C Gregory Dowe, CNM 09/29/2015, 10:41 AM

## 2015-09-29 NOTE — Progress Notes (Signed)
Patient ID: Melody Turner, female   DOB: September 16, 1980, 35 y.o.   MRN: [id number] Subjective: POD# 2 Information for the patient's newborn:  Melody Turner, [name] [[id number]][[id number]]  female  "Melody Turner" Breastfeeding / circ completed  Reports feeling well, ready for DC home Feeding: breast Patient reports tolerating PO.  Breast symptoms: no pain w/ latch Pain controlled withNSAID, duramorph Denies HA/SOB/C/P/N/V/dizziness. Flatus & BM today. She reports vaginal bleeding as normal, without clots.  She is ambulating, urinating without difficult.     Objective:   VS:  Filed Vitals:   09/28/15 0758 09/28/15 1244 09/28/15 2300 09/29/15 0535  BP: 105/64 105/60 118/62 120/57  Pulse: 66 78 84 69  Temp: 97.4 F (36.3 C) 98.3 F (36.8 C) 97.6 F (36.4 C) 98 F (36.7 C)  TempSrc: Oral Oral Oral Oral  Resp: 18  18 18   SpO2: 97% 97% 98%      Intake/Output Summary (Last 24 hours) at 09/29/15 1029 Last data filed at 09/28/15 1044  Gross per 24 hour  Intake      0 ml  Output    500 ml  Net   -500 ml        Recent Labs  09/28/15 0538  WBC 10.2  HGB 12.6  HCT 37.0  PLT 144*     Blood type: --/--/O NEG (07/11 0945) infant Rh neg  Rubella: Immune (12/20 0000)     Physical Exam:  General: alert, cooperative and no distress Abdomen: soft, nontender, normal bowel sounds Incision: clean, dry and intact Uterine Fundus: firm, below umbilicus, nontender Lochia: minimal Ext: no edema, redness or tenderness in the calves or thighs      Assessment/Plan: 35 y.o.   POD# 2. [id number]                  Principal Problem:   Postpartum care following repeat cesarean delivery (7/12) Active Problems:   Previous cesarean delivery affecting pregnancy RH neg, newborn Rh neg, Rhogam not indicated Doing well, stable.               Routine post-op care DC home today  Neta MendsDaniela C Turner, CNM 09/29/2015, 10:29 AM

## 2017-05-20 DIAGNOSIS — M9902 Segmental and somatic dysfunction of thoracic region: Secondary | ICD-10-CM | POA: Diagnosis not present

## 2017-05-20 DIAGNOSIS — M542 Cervicalgia: Secondary | ICD-10-CM | POA: Diagnosis not present

## 2017-05-20 DIAGNOSIS — M9901 Segmental and somatic dysfunction of cervical region: Secondary | ICD-10-CM | POA: Diagnosis not present

## 2017-06-03 DIAGNOSIS — M9902 Segmental and somatic dysfunction of thoracic region: Secondary | ICD-10-CM | POA: Diagnosis not present

## 2017-06-03 DIAGNOSIS — M9901 Segmental and somatic dysfunction of cervical region: Secondary | ICD-10-CM | POA: Diagnosis not present

## 2017-06-03 DIAGNOSIS — M542 Cervicalgia: Secondary | ICD-10-CM | POA: Diagnosis not present

## 2017-08-06 DIAGNOSIS — Z0000 Encounter for general adult medical examination without abnormal findings: Secondary | ICD-10-CM | POA: Diagnosis not present

## 2017-08-06 DIAGNOSIS — E039 Hypothyroidism, unspecified: Secondary | ICD-10-CM | POA: Diagnosis not present

## 2017-08-06 DIAGNOSIS — Z6822 Body mass index (BMI) 22.0-22.9, adult: Secondary | ICD-10-CM | POA: Diagnosis not present

## 2019-08-18 DIAGNOSIS — E039 Hypothyroidism, unspecified: Secondary | ICD-10-CM | POA: Diagnosis not present

## 2019-09-03 DIAGNOSIS — J309 Allergic rhinitis, unspecified: Secondary | ICD-10-CM | POA: Diagnosis not present

## 2019-09-03 DIAGNOSIS — F339 Major depressive disorder, recurrent, unspecified: Secondary | ICD-10-CM | POA: Diagnosis not present

## 2019-09-03 DIAGNOSIS — E039 Hypothyroidism, unspecified: Secondary | ICD-10-CM | POA: Diagnosis not present

## 2019-09-03 DIAGNOSIS — Z91018 Allergy to other foods: Secondary | ICD-10-CM | POA: Diagnosis not present

## 2019-09-08 DIAGNOSIS — J309 Allergic rhinitis, unspecified: Secondary | ICD-10-CM | POA: Diagnosis not present

## 2019-09-09 DIAGNOSIS — J309 Allergic rhinitis, unspecified: Secondary | ICD-10-CM | POA: Diagnosis not present

## 2019-09-10 DIAGNOSIS — J309 Allergic rhinitis, unspecified: Secondary | ICD-10-CM | POA: Diagnosis not present

## 2019-09-13 DIAGNOSIS — J309 Allergic rhinitis, unspecified: Secondary | ICD-10-CM | POA: Diagnosis not present

## 2019-09-14 DIAGNOSIS — J309 Allergic rhinitis, unspecified: Secondary | ICD-10-CM | POA: Diagnosis not present

## 2019-09-15 DIAGNOSIS — J309 Allergic rhinitis, unspecified: Secondary | ICD-10-CM | POA: Diagnosis not present

## 2019-09-16 DIAGNOSIS — J309 Allergic rhinitis, unspecified: Secondary | ICD-10-CM | POA: Diagnosis not present

## 2019-09-17 DIAGNOSIS — J309 Allergic rhinitis, unspecified: Secondary | ICD-10-CM | POA: Diagnosis not present

## 2019-09-23 DIAGNOSIS — Z681 Body mass index (BMI) 19 or less, adult: Secondary | ICD-10-CM | POA: Diagnosis not present

## 2019-09-23 DIAGNOSIS — J309 Allergic rhinitis, unspecified: Secondary | ICD-10-CM | POA: Diagnosis not present

## 2019-09-23 DIAGNOSIS — F339 Major depressive disorder, recurrent, unspecified: Secondary | ICD-10-CM | POA: Diagnosis not present

## 2019-10-08 DIAGNOSIS — E039 Hypothyroidism, unspecified: Secondary | ICD-10-CM | POA: Diagnosis not present

## 2019-10-15 DIAGNOSIS — Z681 Body mass index (BMI) 19 or less, adult: Secondary | ICD-10-CM | POA: Diagnosis not present

## 2019-10-15 DIAGNOSIS — E039 Hypothyroidism, unspecified: Secondary | ICD-10-CM | POA: Diagnosis not present

## 2019-10-15 DIAGNOSIS — J309 Allergic rhinitis, unspecified: Secondary | ICD-10-CM | POA: Diagnosis not present

## 2019-10-15 DIAGNOSIS — F339 Major depressive disorder, recurrent, unspecified: Secondary | ICD-10-CM | POA: Diagnosis not present

## 2020-03-16 DIAGNOSIS — E039 Hypothyroidism, unspecified: Secondary | ICD-10-CM | POA: Diagnosis not present

## 2020-03-23 DIAGNOSIS — E039 Hypothyroidism, unspecified: Secondary | ICD-10-CM | POA: Diagnosis not present

## 2020-03-23 DIAGNOSIS — Z6821 Body mass index (BMI) 21.0-21.9, adult: Secondary | ICD-10-CM | POA: Diagnosis not present

## 2020-03-23 DIAGNOSIS — F339 Major depressive disorder, recurrent, unspecified: Secondary | ICD-10-CM | POA: Diagnosis not present

## 2020-04-11 DIAGNOSIS — N390 Urinary tract infection, site not specified: Secondary | ICD-10-CM | POA: Diagnosis not present

## 2020-04-11 DIAGNOSIS — Z6821 Body mass index (BMI) 21.0-21.9, adult: Secondary | ICD-10-CM | POA: Diagnosis not present

## 2020-05-05 DIAGNOSIS — E039 Hypothyroidism, unspecified: Secondary | ICD-10-CM | POA: Diagnosis not present

## 2020-06-01 DIAGNOSIS — M5413 Radiculopathy, cervicothoracic region: Secondary | ICD-10-CM | POA: Diagnosis not present

## 2020-06-01 DIAGNOSIS — M9902 Segmental and somatic dysfunction of thoracic region: Secondary | ICD-10-CM | POA: Diagnosis not present

## 2020-06-01 DIAGNOSIS — M62830 Muscle spasm of back: Secondary | ICD-10-CM | POA: Diagnosis not present

## 2020-06-01 DIAGNOSIS — M9901 Segmental and somatic dysfunction of cervical region: Secondary | ICD-10-CM | POA: Diagnosis not present

## 2020-06-08 DIAGNOSIS — M9901 Segmental and somatic dysfunction of cervical region: Secondary | ICD-10-CM | POA: Diagnosis not present

## 2020-06-08 DIAGNOSIS — M5413 Radiculopathy, cervicothoracic region: Secondary | ICD-10-CM | POA: Diagnosis not present

## 2020-06-08 DIAGNOSIS — M62830 Muscle spasm of back: Secondary | ICD-10-CM | POA: Diagnosis not present

## 2020-06-08 DIAGNOSIS — M9902 Segmental and somatic dysfunction of thoracic region: Secondary | ICD-10-CM | POA: Diagnosis not present

## 2020-08-15 DIAGNOSIS — R300 Dysuria: Secondary | ICD-10-CM | POA: Diagnosis not present

## 2020-08-15 DIAGNOSIS — N390 Urinary tract infection, site not specified: Secondary | ICD-10-CM | POA: Diagnosis not present

## 2020-08-15 DIAGNOSIS — Z6821 Body mass index (BMI) 21.0-21.9, adult: Secondary | ICD-10-CM | POA: Diagnosis not present

## 2020-09-14 DIAGNOSIS — E039 Hypothyroidism, unspecified: Secondary | ICD-10-CM | POA: Diagnosis not present

## 2020-09-14 DIAGNOSIS — Z131 Encounter for screening for diabetes mellitus: Secondary | ICD-10-CM | POA: Diagnosis not present

## 2020-09-14 DIAGNOSIS — Z13220 Encounter for screening for lipoid disorders: Secondary | ICD-10-CM | POA: Diagnosis not present

## 2020-09-21 DIAGNOSIS — Z6821 Body mass index (BMI) 21.0-21.9, adult: Secondary | ICD-10-CM | POA: Diagnosis not present

## 2020-09-21 DIAGNOSIS — E785 Hyperlipidemia, unspecified: Secondary | ICD-10-CM | POA: Diagnosis not present

## 2020-09-21 DIAGNOSIS — F339 Major depressive disorder, recurrent, unspecified: Secondary | ICD-10-CM | POA: Diagnosis not present

## 2020-09-21 DIAGNOSIS — E039 Hypothyroidism, unspecified: Secondary | ICD-10-CM | POA: Diagnosis not present

## 2020-11-23 DIAGNOSIS — E039 Hypothyroidism, unspecified: Secondary | ICD-10-CM | POA: Diagnosis not present

## 2020-12-01 DIAGNOSIS — Z6822 Body mass index (BMI) 22.0-22.9, adult: Secondary | ICD-10-CM | POA: Diagnosis not present

## 2020-12-01 DIAGNOSIS — E039 Hypothyroidism, unspecified: Secondary | ICD-10-CM | POA: Diagnosis not present

## 2021-01-17 DIAGNOSIS — E039 Hypothyroidism, unspecified: Secondary | ICD-10-CM | POA: Diagnosis not present

## 2021-03-02 DIAGNOSIS — Z6825 Body mass index (BMI) 25.0-25.9, adult: Secondary | ICD-10-CM | POA: Diagnosis not present

## 2021-03-02 DIAGNOSIS — Z124 Encounter for screening for malignant neoplasm of cervix: Secondary | ICD-10-CM | POA: Diagnosis not present

## 2021-03-02 DIAGNOSIS — Z01419 Encounter for gynecological examination (general) (routine) without abnormal findings: Secondary | ICD-10-CM | POA: Diagnosis not present

## 2021-03-23 DIAGNOSIS — E039 Hypothyroidism, unspecified: Secondary | ICD-10-CM | POA: Diagnosis not present

## 2021-05-09 DIAGNOSIS — Z20828 Contact with and (suspected) exposure to other viral communicable diseases: Secondary | ICD-10-CM | POA: Diagnosis not present

## 2021-05-09 DIAGNOSIS — J029 Acute pharyngitis, unspecified: Secondary | ICD-10-CM | POA: Diagnosis not present

## 2021-05-09 DIAGNOSIS — J019 Acute sinusitis, unspecified: Secondary | ICD-10-CM | POA: Diagnosis not present

## 2021-05-09 DIAGNOSIS — R509 Fever, unspecified: Secondary | ICD-10-CM | POA: Diagnosis not present

## 2021-07-18 DIAGNOSIS — E039 Hypothyroidism, unspecified: Secondary | ICD-10-CM | POA: Diagnosis not present

## 2021-08-02 DIAGNOSIS — F339 Major depressive disorder, recurrent, unspecified: Secondary | ICD-10-CM | POA: Diagnosis not present

## 2021-08-02 DIAGNOSIS — E785 Hyperlipidemia, unspecified: Secondary | ICD-10-CM | POA: Diagnosis not present

## 2021-08-02 DIAGNOSIS — E039 Hypothyroidism, unspecified: Secondary | ICD-10-CM | POA: Diagnosis not present

## 2021-08-02 DIAGNOSIS — Z23 Encounter for immunization: Secondary | ICD-10-CM | POA: Diagnosis not present

## 2021-08-03 DIAGNOSIS — E785 Hyperlipidemia, unspecified: Secondary | ICD-10-CM | POA: Diagnosis not present

## 2021-08-03 DIAGNOSIS — R7301 Impaired fasting glucose: Secondary | ICD-10-CM | POA: Diagnosis not present

## 2021-11-02 DIAGNOSIS — Z1231 Encounter for screening mammogram for malignant neoplasm of breast: Secondary | ICD-10-CM | POA: Diagnosis not present

## 2022-01-25 DIAGNOSIS — E039 Hypothyroidism, unspecified: Secondary | ICD-10-CM | POA: Diagnosis not present

## 2022-01-25 DIAGNOSIS — E785 Hyperlipidemia, unspecified: Secondary | ICD-10-CM | POA: Diagnosis not present

## 2022-01-31 DIAGNOSIS — E785 Hyperlipidemia, unspecified: Secondary | ICD-10-CM | POA: Diagnosis not present

## 2022-01-31 DIAGNOSIS — Z6822 Body mass index (BMI) 22.0-22.9, adult: Secondary | ICD-10-CM | POA: Diagnosis not present

## 2022-01-31 DIAGNOSIS — Z1331 Encounter for screening for depression: Secondary | ICD-10-CM | POA: Diagnosis not present

## 2022-01-31 DIAGNOSIS — E039 Hypothyroidism, unspecified: Secondary | ICD-10-CM | POA: Diagnosis not present

## 2022-01-31 DIAGNOSIS — F339 Major depressive disorder, recurrent, unspecified: Secondary | ICD-10-CM | POA: Diagnosis not present

## 2022-05-24 DIAGNOSIS — Z01419 Encounter for gynecological examination (general) (routine) without abnormal findings: Secondary | ICD-10-CM | POA: Diagnosis not present

## 2022-05-24 DIAGNOSIS — Z6824 Body mass index (BMI) 24.0-24.9, adult: Secondary | ICD-10-CM | POA: Diagnosis not present

## 2022-05-24 DIAGNOSIS — Z1231 Encounter for screening mammogram for malignant neoplasm of breast: Secondary | ICD-10-CM | POA: Diagnosis not present

## 2022-05-24 DIAGNOSIS — Z124 Encounter for screening for malignant neoplasm of cervix: Secondary | ICD-10-CM | POA: Diagnosis not present

## 2022-06-21 DIAGNOSIS — E785 Hyperlipidemia, unspecified: Secondary | ICD-10-CM | POA: Diagnosis not present

## 2022-06-21 DIAGNOSIS — E039 Hypothyroidism, unspecified: Secondary | ICD-10-CM | POA: Diagnosis not present

## 2022-08-08 DIAGNOSIS — E039 Hypothyroidism, unspecified: Secondary | ICD-10-CM | POA: Diagnosis not present

## 2022-08-08 DIAGNOSIS — F339 Major depressive disorder, recurrent, unspecified: Secondary | ICD-10-CM | POA: Diagnosis not present

## 2022-08-08 DIAGNOSIS — Z6821 Body mass index (BMI) 21.0-21.9, adult: Secondary | ICD-10-CM | POA: Diagnosis not present

## 2022-08-08 DIAGNOSIS — E785 Hyperlipidemia, unspecified: Secondary | ICD-10-CM | POA: Diagnosis not present

## 2022-10-16 DIAGNOSIS — R21 Rash and other nonspecific skin eruption: Secondary | ICD-10-CM | POA: Diagnosis not present

## 2022-10-16 DIAGNOSIS — Z6822 Body mass index (BMI) 22.0-22.9, adult: Secondary | ICD-10-CM | POA: Diagnosis not present

## 2022-10-16 DIAGNOSIS — R590 Localized enlarged lymph nodes: Secondary | ICD-10-CM | POA: Diagnosis not present

## 2022-10-18 DIAGNOSIS — Z6822 Body mass index (BMI) 22.0-22.9, adult: Secondary | ICD-10-CM | POA: Diagnosis not present

## 2022-10-18 DIAGNOSIS — R590 Localized enlarged lymph nodes: Secondary | ICD-10-CM | POA: Diagnosis not present

## 2022-10-18 DIAGNOSIS — R21 Rash and other nonspecific skin eruption: Secondary | ICD-10-CM | POA: Diagnosis not present

## 2022-11-01 DIAGNOSIS — R21 Rash and other nonspecific skin eruption: Secondary | ICD-10-CM | POA: Diagnosis not present

## 2022-11-01 DIAGNOSIS — R590 Localized enlarged lymph nodes: Secondary | ICD-10-CM | POA: Diagnosis not present

## 2022-11-01 DIAGNOSIS — Z6822 Body mass index (BMI) 22.0-22.9, adult: Secondary | ICD-10-CM | POA: Diagnosis not present

## 2023-01-10 DIAGNOSIS — E785 Hyperlipidemia, unspecified: Secondary | ICD-10-CM | POA: Diagnosis not present

## 2023-01-10 DIAGNOSIS — E039 Hypothyroidism, unspecified: Secondary | ICD-10-CM | POA: Diagnosis not present

## 2023-01-16 DIAGNOSIS — E039 Hypothyroidism, unspecified: Secondary | ICD-10-CM | POA: Diagnosis not present

## 2023-01-16 DIAGNOSIS — Z6822 Body mass index (BMI) 22.0-22.9, adult: Secondary | ICD-10-CM | POA: Diagnosis not present

## 2023-01-16 DIAGNOSIS — Z23 Encounter for immunization: Secondary | ICD-10-CM | POA: Diagnosis not present

## 2023-01-16 DIAGNOSIS — E785 Hyperlipidemia, unspecified: Secondary | ICD-10-CM | POA: Diagnosis not present

## 2023-05-09 DIAGNOSIS — E039 Hypothyroidism, unspecified: Secondary | ICD-10-CM | POA: Diagnosis not present

## 2023-05-09 DIAGNOSIS — E785 Hyperlipidemia, unspecified: Secondary | ICD-10-CM | POA: Diagnosis not present

## 2023-05-15 DIAGNOSIS — L578 Other skin changes due to chronic exposure to nonionizing radiation: Secondary | ICD-10-CM | POA: Diagnosis not present

## 2023-05-15 DIAGNOSIS — D2239 Melanocytic nevi of other parts of face: Secondary | ICD-10-CM | POA: Diagnosis not present

## 2023-05-15 DIAGNOSIS — L814 Other melanin hyperpigmentation: Secondary | ICD-10-CM | POA: Diagnosis not present

## 2023-05-15 DIAGNOSIS — D225 Melanocytic nevi of trunk: Secondary | ICD-10-CM | POA: Diagnosis not present

## 2023-05-15 DIAGNOSIS — D485 Neoplasm of uncertain behavior of skin: Secondary | ICD-10-CM | POA: Diagnosis not present

## 2023-05-22 DIAGNOSIS — E039 Hypothyroidism, unspecified: Secondary | ICD-10-CM | POA: Diagnosis not present

## 2023-05-22 DIAGNOSIS — Z1331 Encounter for screening for depression: Secondary | ICD-10-CM | POA: Diagnosis not present

## 2023-05-22 DIAGNOSIS — Z6823 Body mass index (BMI) 23.0-23.9, adult: Secondary | ICD-10-CM | POA: Diagnosis not present

## 2023-05-22 DIAGNOSIS — E785 Hyperlipidemia, unspecified: Secondary | ICD-10-CM | POA: Diagnosis not present

## 2023-05-22 DIAGNOSIS — F339 Major depressive disorder, recurrent, unspecified: Secondary | ICD-10-CM | POA: Diagnosis not present

## 2023-06-04 DIAGNOSIS — Z1331 Encounter for screening for depression: Secondary | ICD-10-CM | POA: Diagnosis not present

## 2023-06-04 DIAGNOSIS — Z124 Encounter for screening for malignant neoplasm of cervix: Secondary | ICD-10-CM | POA: Diagnosis not present

## 2023-06-04 DIAGNOSIS — Z01411 Encounter for gynecological examination (general) (routine) with abnormal findings: Secondary | ICD-10-CM | POA: Diagnosis not present

## 2023-06-04 DIAGNOSIS — Z1231 Encounter for screening mammogram for malignant neoplasm of breast: Secondary | ICD-10-CM | POA: Diagnosis not present

## 2023-06-04 DIAGNOSIS — Z01419 Encounter for gynecological examination (general) (routine) without abnormal findings: Secondary | ICD-10-CM | POA: Diagnosis not present

## 2023-09-25 DIAGNOSIS — E039 Hypothyroidism, unspecified: Secondary | ICD-10-CM | POA: Diagnosis not present

## 2023-11-27 DIAGNOSIS — Z6822 Body mass index (BMI) 22.0-22.9, adult: Secondary | ICD-10-CM | POA: Diagnosis not present

## 2023-11-27 DIAGNOSIS — R7301 Impaired fasting glucose: Secondary | ICD-10-CM | POA: Diagnosis not present

## 2023-11-27 DIAGNOSIS — E785 Hyperlipidemia, unspecified: Secondary | ICD-10-CM | POA: Diagnosis not present

## 2023-11-27 DIAGNOSIS — E039 Hypothyroidism, unspecified: Secondary | ICD-10-CM | POA: Diagnosis not present
